# Patient Record
Sex: Female | Born: 1935 | Race: White | Hispanic: No | Marital: Married | Smoking: Never smoker
Health system: Southern US, Community
[De-identification: ages and names within clinical notes are randomized; demographics above are authoritative.]

## PROBLEM LIST (undated history)

## (undated) DIAGNOSIS — M810 Age-related osteoporosis without current pathological fracture: Secondary | ICD-10-CM

## (undated) DIAGNOSIS — I10 Essential (primary) hypertension: Secondary | ICD-10-CM

## (undated) DIAGNOSIS — M1990 Unspecified osteoarthritis, unspecified site: Secondary | ICD-10-CM

## (undated) DIAGNOSIS — C801 Malignant (primary) neoplasm, unspecified: Secondary | ICD-10-CM

## (undated) DIAGNOSIS — Z5189 Encounter for other specified aftercare: Secondary | ICD-10-CM

## (undated) DIAGNOSIS — K219 Gastro-esophageal reflux disease without esophagitis: Secondary | ICD-10-CM

## (undated) DIAGNOSIS — R011 Cardiac murmur, unspecified: Secondary | ICD-10-CM

## (undated) DIAGNOSIS — E079 Disorder of thyroid, unspecified: Secondary | ICD-10-CM

## (undated) HISTORY — DX: Age-related osteoporosis without current pathological fracture: M81.0

## (undated) HISTORY — PX: BREAST SURGERY: SHX581

## (undated) HISTORY — PX: MASTOID DEBRIDEMENT: SHX2002

## (undated) HISTORY — DX: Unspecified osteoarthritis, unspecified site: M19.90

## (undated) HISTORY — DX: Gastro-esophageal reflux disease without esophagitis: K21.9

## (undated) HISTORY — DX: Encounter for other specified aftercare: Z51.89

## (undated) HISTORY — PX: OTHER SURGICAL HISTORY: SHX169

## (undated) HISTORY — PX: SPINE SURGERY: SHX786

## (undated) HISTORY — DX: Malignant (primary) neoplasm, unspecified: C80.1

## (undated) HISTORY — DX: Essential (primary) hypertension: I10

## (undated) HISTORY — PX: APPENDECTOMY: SHX54

## (undated) HISTORY — DX: Disorder of thyroid, unspecified: E07.9

## (undated) HISTORY — DX: Cardiac murmur, unspecified: R01.1

## (undated) HISTORY — PX: ABDOMINAL HYSTERECTOMY: SHX81

---

## 2001-10-10 ENCOUNTER — Ambulatory Visit (HOSPITAL_COMMUNITY): Admission: RE | Admit: 2001-10-10 | Discharge: 2001-10-10 | Payer: Self-pay | Admitting: Internal Medicine

## 2001-10-10 ENCOUNTER — Encounter: Payer: Self-pay | Admitting: Internal Medicine

## 2002-03-02 ENCOUNTER — Encounter: Payer: Self-pay | Admitting: Internal Medicine

## 2002-03-02 ENCOUNTER — Ambulatory Visit (HOSPITAL_COMMUNITY): Admission: RE | Admit: 2002-03-02 | Discharge: 2002-03-02 | Payer: Self-pay | Admitting: Internal Medicine

## 2002-08-17 ENCOUNTER — Ambulatory Visit (HOSPITAL_COMMUNITY): Admission: RE | Admit: 2002-08-17 | Discharge: 2002-08-17 | Payer: Self-pay | Admitting: Internal Medicine

## 2002-08-17 ENCOUNTER — Encounter: Payer: Self-pay | Admitting: Internal Medicine

## 2002-10-24 ENCOUNTER — Encounter: Payer: Self-pay | Admitting: Orthopaedic Surgery

## 2002-10-27 ENCOUNTER — Encounter: Payer: Self-pay | Admitting: Orthopaedic Surgery

## 2002-10-27 ENCOUNTER — Encounter (INDEPENDENT_AMBULATORY_CARE_PROVIDER_SITE_OTHER): Payer: Self-pay | Admitting: *Deleted

## 2002-10-27 ENCOUNTER — Inpatient Hospital Stay (HOSPITAL_COMMUNITY): Admission: RE | Admit: 2002-10-27 | Discharge: 2002-10-28 | Payer: Self-pay | Admitting: Orthopaedic Surgery

## 2006-07-03 ENCOUNTER — Encounter: Admission: RE | Admit: 2006-07-03 | Discharge: 2006-07-03 | Payer: Self-pay | Admitting: Orthopaedic Surgery

## 2009-02-26 ENCOUNTER — Encounter: Admission: RE | Admit: 2009-02-26 | Discharge: 2009-02-26 | Payer: Self-pay | Admitting: Orthopaedic Surgery

## 2009-07-10 DEATH — deceased

## 2011-03-27 NOTE — H&P (Signed)
NAMEMarland Kitchen  Donna Hill, Donna Hill                          ACCOUNT NO.:  [id number]   MEDICAL RECORD NO.:  [id number]                   PATIENT TYPE:  OIB   LOCATION:  5025                                 FACILITY:  [facility]   PHYSICIAN:  Sharolyn Douglas, M.D.                     DATE OF BIRTH:  1934-05-27   DATE OF ADMISSION:  10/27/2002  DATE OF DISCHARGE:                                HISTORY & PHYSICAL   CHIEF COMPLAINT:  Back pain.   HISTORY OF PRESENT ILLNESS:  The patient is a 77 year old female with back  pain since October 2003.  It is severe.  It is affecting her activities of  daily living, keeping her up at night.  It has gotten to the point that it  is quite frustrating to her.  Risks and benefits of kyphoplasty procedure  were discussed with the patient by Sharolyn Douglas, M.D., and she indicated  understanding and therefore, elected to proceed.   ALLERGIES:  CODEINE causes nausea and vomiting; ANTIHISTAMINES cause  headaches.   MEDICATIONS:  1. Synthroid 0.15 mg p.o. q.d.  2. Actonel 30 mg p.o. every week.  3. Aciphex 30 mg p.o. q.d.  4. Vioxx 50 mg p.o. q.d.   PAST MEDICAL HISTORY:  1. Heart murmur.  2. GERD.  3. Hypothyroidism.  4. Osteoporosis.   PAST SURGICAL HISTORY:  1. Hysterectomy.  2. Thyroidectomy.   SOCIAL HISTORY:  The patient denies tobacco, denies alcohol.  She has three  children.  She is married.  She lives in a one level home and does have  family to help her through her postoperative course.   FAMILY MEDICAL HISTORY:  CHF, uterine cancer, and osteoarthritis.   REVIEW OF SYSTEMS:  The patient denies any fevers, chills, sweats,  ______________, blurred vision, double vision, seizure, headache, paralysis.  CARDIOVASCULAR: No chest pain, angina.  No claudication or palpitation.  PULMONARY: No shortness of breath or hemoptysis.  GI: Denies nausea,  vomiting, constipation, diarrhea.  Mild ______________.  She denies dysuria,  hematuria.  MUSCULOSKELETAL: Denies  any paresthesias, numbness, or paralysis  of her extremities.   PHYSICAL EXAMINATION:  VITAL SIGNS:  Blood pressure 152/82, respirations 16  and unlabored, pulse 66 and regular.  GENERAL:  This is a 77 year old white female who is alert and oriented in no  acute distress, well nourished, well groomed, appears her stated age,  pleasant, cooperative to examination.  HEENT: Normocephalic, atraumatic.  Pupils are equal, round, and reactive.  Extraocular movements are intact.  Nose: Patent.  Pharynx: Clear.  NECK: Supple to palpation, no bruits  appreciated, no lymphadenopathy or thyromegaly noted.  CHEST: Clear to  auscultation bilaterally, no rales, rhonchi, stridor or wheezes.  ________________ not performed.  HEART: S1, S2, regular rate and rhythm, no  murmurs, gallops, or rubs appreciated on today's exam.  ABDOMEN: Soft to  palpation, nontender, nondistended, no organomegaly noted.  GU: Not  pertinent, not performed.  EXTREMITIES: Sensation is intact to bilateral  lower extremities.  Motor function is grossly intact.  Pulses are intact and  equal.  SKIN: Intact without any lesions or rashes.   LABORATORY DATA:  X-ray shows T8 compression fracture.   IMPRESSION:  1. T8 compression fracture.  2. History of heart murmur.  3. Gastroesophageal reflux disease.  4. Hypothyroidism.   PLAN:  1. Admit to [facility] on October 27, 2002 for a T8 kyphoplasty to     be done by Sharolyn Douglas, M.D.  2. The patient's primary care physician is Angelena Sole, M.D. [facility].     Verlin Fester, P.A.                       Sharolyn Douglas, M.D.    CM/MEDQ  D:  11/04/2002  T:  11/04/2002  Job:  [id number]

## 2011-03-27 NOTE — Op Note (Signed)
NAMEMarland Kitchen  Donna Hill, Donna Hill                          ACCOUNT NO.:  [id number]   MEDICAL RECORD NO.:  [id number]                   PATIENT TYPE:  OIB   LOCATION:  5025                                 FACILITY:  [facility]   PHYSICIAN:  Sharolyn Douglas, M.D.                     DATE OF BIRTH:  [birth year]   DATE OF PROCEDURE:  10/27/2002  DATE OF DISCHARGE:  10/28/2002                                 OPERATIVE REPORT   DIAGNOSIS:  Pathologic T8 compression fracture with intractable pain.   PROCEDURE:  1. Closed reduction of T8 vertebral compression fracture.  2. Thoracic percutaneous vertebroplasty, T8.  3. Deep vertebral bone biopsy, T8.  4. Radiographic interpretation of fluoroscopic images used for     vertebroplasty, T8.   SURGEON:  Sharolyn Douglas, M.D.   ASSISTANT:  Verlin Fester, P.A.   ANESTHESIA:  General endotracheal.   COMPLICATIONS:  None.   INDICATIONS FOR PROCEDURE:  This patient is a 77 year old female who has a  history of osteoporosis and compression fractures.  She has a greater than 3  month history of intractable midthoracic pain.  MRI scan and radiographs  showed a T8 compression deformity with progressive collapse.  Her bone scan  showed this lesion to be hot.  She had several other compression  deformities, including T6 and T11 that did not light up on the bone scan or  MRI.  She tried bracing and pain medication.  Because of her persistent  symptoms, she elected to undergo kyphoplasty in hopes of restoring her  deformity and improving her pain.   PROCEDURE:  The patient was properly identified in the holding area and  taken to the operating room.  She underwent general endotracheal anesthesia  without difficulty. She was given prophylactic IV antibiotics.  She was  carefully turned prone with bolsters placed transversely across her  shoulders and pelvis in order to posturally reduce the fracture.  We used  fluoroscopy to confirm that we had a partial reduction of the T8  fracture.  We then prepped and draped the back in the usual sterile fashion.  Biplanar  fluoroscopy was brought into the field.  We obtained true AP and lateral  images of T8.  A Jamshidi needle was used to cannulate the pedicle on the  left side using live AP and lateral fluoroscopy.  A guide wire was placed  through the Jamshidi needle.  A working cannula  was established over the  guide wire.  We used a biopsy trocar to obtain a bone biopsy of the T8  vertebral body.  This was sent to pathology for routine examination.  We  then placed an intervertebral bone tamp through the working cannula.  This  was inflated to 3 cc.  We watched it from the live fluoroscopy.  We were  able to cross the midline.  We lifted the end plates up approximately 30%.  We then removed the intervertebral bone tamp and placed 3 cc of semi-  hardened methyl methacrylate cement mixed with barium through the working  cannula into the cavity created by the bone tamp.  This was observed under  direct AP and lateral imaging.  The working cannula was removed.  Final AP  lateral images were saved.  3 cc of 0.5% Marcaine were injected into the  small stab incision for postoperative  anesthesia.  A simple 3-0 nylon suture was utilized to approximate the skin  edges.  A sterile dressing was applied.  The patient was turned supine, was  extubated without difficulty, and transferred to the recovery room, able to  move her upper and lower extremities without difficulty.                                               Sharolyn Douglas, M.D.    MC/MEDQ  D:  10/27/2002  T:  10/29/2002  Job:  [id number]

## 2011-06-25 ENCOUNTER — Other Ambulatory Visit: Payer: Self-pay | Admitting: Orthopaedic Surgery

## 2011-06-25 DIAGNOSIS — M545 Low back pain: Secondary | ICD-10-CM

## 2011-06-26 ENCOUNTER — Ambulatory Visit
Admission: RE | Admit: 2011-06-26 | Discharge: 2011-06-26 | Disposition: A | Discharge status: Home / Self Care | Payer: Medicare Other | Source: Ambulatory Visit | Attending: Orthopaedic Surgery | Admitting: Orthopaedic Surgery

## 2011-06-26 DIAGNOSIS — M545 Low back pain, unspecified: Secondary | ICD-10-CM

## 2012-09-10 ENCOUNTER — Ambulatory Visit (INDEPENDENT_AMBULATORY_CARE_PROVIDER_SITE_OTHER): Payer: Medicare Other | Admitting: Internal Medicine

## 2012-09-10 ENCOUNTER — Ambulatory Visit: Payer: Medicare Other

## 2012-09-10 VITALS — BP 100/68 | HR 87 | Temp 98.2°F | Resp 18 | Ht <= 58 in | Wt 115.0 lb

## 2012-09-10 DIAGNOSIS — R05 Cough: Secondary | ICD-10-CM

## 2012-09-10 DIAGNOSIS — R059 Cough, unspecified: Secondary | ICD-10-CM

## 2012-09-10 DIAGNOSIS — M810 Age-related osteoporosis without current pathological fracture: Secondary | ICD-10-CM

## 2012-09-10 DIAGNOSIS — Z201 Contact with and (suspected) exposure to tuberculosis: Secondary | ICD-10-CM

## 2012-09-10 DIAGNOSIS — E039 Hypothyroidism, unspecified: Secondary | ICD-10-CM

## 2012-09-10 DIAGNOSIS — R61 Generalized hyperhidrosis: Secondary | ICD-10-CM

## 2012-09-10 DIAGNOSIS — I10 Essential (primary) hypertension: Secondary | ICD-10-CM

## 2012-09-10 DIAGNOSIS — K219 Gastro-esophageal reflux disease without esophagitis: Secondary | ICD-10-CM

## 2012-09-11 DIAGNOSIS — K219 Gastro-esophageal reflux disease without esophagitis: Secondary | ICD-10-CM | POA: Insufficient documentation

## 2012-09-11 DIAGNOSIS — E039 Hypothyroidism, unspecified: Secondary | ICD-10-CM | POA: Insufficient documentation

## 2012-09-11 DIAGNOSIS — I10 Essential (primary) hypertension: Secondary | ICD-10-CM | POA: Insufficient documentation

## 2012-09-11 DIAGNOSIS — M810 Age-related osteoporosis without current pathological fracture: Secondary | ICD-10-CM | POA: Insufficient documentation

## 2012-09-11 NOTE — Progress Notes (Signed)
  Subjective:    Patient ID: Donna Hill, female    DOB: 08-Oct-1934, 78 y.o.   MRN: [id number]  HPItoddler grandchild dx w/ TB this week --family needs screening Donna Hill Has complained of chronic cough for 4 or 5 months/she has a history of night sweats but no fevers/is no history weight loss She has no known exposure to TB/there is no fatigue or activity change over the last few months Evaluation by her primary care provider provided no etiology for the cough but chest x-ray was done  Patient Active Problem List  Diagnosis  . Hypertension  . Hypothyroidism  . Osteoporosis  . GERD (gastroesophageal reflux disease)    Prior to Admission medications   Medication Sig Start Date End Date Taking? Authorizing Provider  bisoprolol-hydrochlorothiazide (ZIAC) 10-6.25 MG per tablet Take 1 tablet by mouth daily.   Yes Historical Provider, MD  levothyroxine (SYNTHROID, LEVOTHROID) 150 MCG tablet Take 150 mcg by mouth daily.   Yes Historical Provider, MD  olmesartan-hydrochlorothiazide (BENICAR HCT) 20-12.5 MG per tablet Take 1 tablet by mouth daily.   Yes Historical Provider, MD  RABEprazole (ACIPHEX) 20 MG tablet Take 20 mg by mouth daily.   Yes Historical Provider, MD  risedronate (ACTONEL) 150 MG tablet Take 150 mg by mouth every 30 (thirty) days. with water on empty stomach, nothing by mouth or lie down for next 30 minutes.   Yes Historical Provider, MD       Review of Systems No fever chills night sweats weight loss or fatigue   No shortness of breath or dyspnea on exertion No chest pain or palpitations No peripheral edema No weight loss Objective:   Physical Exam Filed Vitals:   09/10/12 1706  BP: 100/68  Pulse: 87  Temp: 98.2 F (36.8 C)  Resp: 18   Kyphotic posture HEENT clear Lungs clear to auscultation Heart regular without murmur Extremities without edema       [facility] reading (PRIMARY) by  Dr. Leonia Heatherly=chronic changes but no signs of TB//   Assessment & Plan:    Problem #1 exposure to TB Problem #2 chronic cough Problem #3 other medical problems as noted  Quant interferon gold Reassured

## 2012-09-14 ENCOUNTER — Encounter: Payer: Self-pay | Admitting: Internal Medicine

## 2012-09-15 ENCOUNTER — Telehealth: Payer: Self-pay | Admitting: Radiology

## 2012-09-15 NOTE — Telephone Encounter (Signed)
I mailed her a letter w/ results on 11/6 so she hasn't gotten them yet I think she waits 1 year before retesting unless she develops symptoms Let me know what [facility] says

## 2012-09-15 NOTE — Telephone Encounter (Signed)
Patient called, for her lab results, they have not been reviewed yet. I have spoken to patient, advised her I will call her with results. The results are negative, however I have questions about her follow up, whether or not she will need testing again at a later date due to exposure. I have discussed with Chelle and she has reccommended we call the TB clinic to get their recommendations I have left message for the TB clinic to call back about this patient.  Patient call back # is [phone]. Also she forgot to tell Dr Merla Riches she had hx of thyroid CA, I have put this in the computer.  FYI only

## 2012-09-15 NOTE — Telephone Encounter (Signed)
Thanks they recommend repeat of this blood test in 8 weeks if she had a true exposure to active tuberculosis. They state they have no records of a 78 yr old with TB, was this a positive skin test, or a true active case of TB? If it was an active TB case there is concern at the health dept it may not have been reported. Please advise.

## 2012-09-16 NOTE — Telephone Encounter (Signed)
Patient and daughter reported to me that this was a positive case and they have been referred for all family members be tested/have no other information or confirmation You'll have to call the patient or her daughter to get further information at this point

## 2012-09-17 ENCOUNTER — Telehealth: Payer: Self-pay

## 2012-09-17 NOTE — Telephone Encounter (Signed)
Pt called in regards to TB test results. Please call pt back  @ [phone]

## 2012-09-18 ENCOUNTER — Telehealth: Payer: Self-pay

## 2012-09-18 NOTE — Telephone Encounter (Signed)
Advised to get mucinex dm and to come in if no better

## 2012-09-18 NOTE — Telephone Encounter (Signed)
Will it be okay to notify pt that tb lab test was negative and that she needs to RTC

## 2012-09-18 NOTE — Telephone Encounter (Signed)
PATIENT NOTIFIED TB RESULTS HAVE BEEN MAILED TO HER SINCE 11/6, SHE HAS CALLED SEVERAL TIMES WANTING RESULTS. PATIENT IS NOW REPORTING COUGH AND TAKING MUCI NEX BUT HER SYMPTOMS HAVE GOTTEN WORSE. SHE BELIEVES SHE HAS PNEUMONIA. PATIENT  IS ADVISED IF SHE IS ILL TO PLEASE BE SEEN IF ITS AN EMERGENCY.

## 2012-09-18 NOTE — Telephone Encounter (Signed)
yes

## 2012-09-19 ENCOUNTER — Encounter: Payer: Self-pay | Admitting: Internal Medicine

## 2012-09-19 NOTE — Telephone Encounter (Signed)
Then the negative test should prove she did not catch TB with that exposure-no need for retesting Thanks for getting the complete history

## 2012-09-19 NOTE — Telephone Encounter (Signed)
Patient advised of normal results. Advised will call her back to let her know if further testing is required.

## 2012-09-19 NOTE — Telephone Encounter (Signed)
Thanks, called patient she is advised no need for further testing. Left message for her.

## 2012-09-19 NOTE — Telephone Encounter (Signed)
The child was visiting from out of town. Last Spring, baby is from [facility]. Baby was treated in hospital, long duration. Had lymph nodes removed, then dx was made based on the results of the lymph node biopsy. Baby has been treated. Health Dept had recommeded repeat testing if this was new exposure, but was several months ago. I told patient I will discuss with you then call her back. Amy

## 2012-11-29 ENCOUNTER — Other Ambulatory Visit: Payer: Self-pay | Admitting: Dermatology

## 2013-03-10 ENCOUNTER — Other Ambulatory Visit: Payer: Self-pay | Admitting: Dermatology

## 2014-09-13 ENCOUNTER — Other Ambulatory Visit: Payer: Self-pay | Admitting: Orthopaedic Surgery

## 2014-09-13 DIAGNOSIS — M545 Low back pain: Secondary | ICD-10-CM

## 2014-09-13 DIAGNOSIS — M419 Scoliosis, unspecified: Secondary | ICD-10-CM

## 2014-09-23 ENCOUNTER — Ambulatory Visit
Admission: RE | Admit: 2014-09-23 | Discharge: 2014-09-23 | Disposition: A | Discharge status: Home / Self Care | Payer: Medicare Other | Source: Ambulatory Visit | Attending: Orthopaedic Surgery | Admitting: Orthopaedic Surgery

## 2014-09-23 DIAGNOSIS — M545 Low back pain: Secondary | ICD-10-CM

## 2014-09-23 DIAGNOSIS — M419 Scoliosis, unspecified: Secondary | ICD-10-CM

## 2016-12-07 ENCOUNTER — Other Ambulatory Visit: Payer: Self-pay | Admitting: Dermatology

## 2018-06-03 ENCOUNTER — Other Ambulatory Visit: Payer: Self-pay | Admitting: Rehabilitation

## 2018-06-03 DIAGNOSIS — T148XXA Other injury of unspecified body region, initial encounter: Secondary | ICD-10-CM

## 2018-06-11 ENCOUNTER — Ambulatory Visit
Admission: RE | Admit: 2018-06-11 | Discharge: 2018-06-11 | Disposition: A | Discharge status: Home / Self Care | Payer: Medicare Other | Source: Ambulatory Visit | Attending: Rehabilitation | Admitting: Rehabilitation

## 2018-06-11 DIAGNOSIS — T148XXA Other injury of unspecified body region, initial encounter: Secondary | ICD-10-CM

## 2019-06-15 ENCOUNTER — Emergency Department (HOSPITAL_COMMUNITY)
Admission: EM | Admit: 2019-06-15 | Discharge: 2019-06-15 | Disposition: A | Discharge status: Home / Self Care | Payer: Medicare Other | Attending: Emergency Medicine | Admitting: Emergency Medicine

## 2019-06-15 ENCOUNTER — Encounter (HOSPITAL_COMMUNITY): Payer: Self-pay | Admitting: Emergency Medicine

## 2019-06-15 ENCOUNTER — Other Ambulatory Visit: Payer: Self-pay

## 2019-06-15 ENCOUNTER — Emergency Department (HOSPITAL_COMMUNITY): Payer: Medicare Other

## 2019-06-15 DIAGNOSIS — R011 Cardiac murmur, unspecified: Secondary | ICD-10-CM | POA: Diagnosis not present

## 2019-06-15 DIAGNOSIS — M25532 Pain in left wrist: Secondary | ICD-10-CM | POA: Diagnosis not present

## 2019-06-15 DIAGNOSIS — Z885 Allergy status to narcotic agent status: Secondary | ICD-10-CM | POA: Insufficient documentation

## 2019-06-15 DIAGNOSIS — Z85850 Personal history of malignant neoplasm of thyroid: Secondary | ICD-10-CM | POA: Diagnosis not present

## 2019-06-15 DIAGNOSIS — Z79899 Other long term (current) drug therapy: Secondary | ICD-10-CM | POA: Diagnosis not present

## 2019-06-15 DIAGNOSIS — Z888 Allergy status to other drugs, medicaments and biological substances status: Secondary | ICD-10-CM | POA: Insufficient documentation

## 2019-06-15 DIAGNOSIS — I10 Essential (primary) hypertension: Secondary | ICD-10-CM | POA: Diagnosis not present

## 2019-06-15 DIAGNOSIS — M25432 Effusion, left wrist: Secondary | ICD-10-CM

## 2019-06-15 DIAGNOSIS — R2232 Localized swelling, mass and lump, left upper limb: Secondary | ICD-10-CM | POA: Diagnosis present

## 2019-06-15 LAB — CBC WITH DIFFERENTIAL/PLATELET
Abs Immature Granulocytes: 0.04 10*3/uL (ref 0.00–0.07)
Basophils Absolute: 0 10*3/uL (ref 0.0–0.1)
Basophils Relative: 0 %
Eosinophils Absolute: 0 10*3/uL (ref 0.0–0.5)
Eosinophils Relative: 0 %
HCT: 39.1 % (ref 36.0–46.0)
Hemoglobin: 12.3 g/dL (ref 12.0–15.0)
Immature Granulocytes: 1 %
Lymphocytes Relative: 6 %
Lymphs Abs: 0.4 10*3/uL — ABNORMAL LOW (ref 0.7–4.0)
MCH: 27.3 pg (ref 26.0–34.0)
MCHC: 31.5 g/dL (ref 30.0–36.0)
MCV: 86.7 fL (ref 80.0–100.0)
Monocytes Absolute: 0.9 10*3/uL (ref 0.1–1.0)
Monocytes Relative: 12 %
Neutro Abs: 6.4 10*3/uL (ref 1.7–7.7)
Neutrophils Relative %: 81 %
Platelets: 235 10*3/uL (ref 150–400)
RBC: 4.51 MIL/uL (ref 3.87–5.11)
RDW: 14.5 % (ref 11.5–15.5)
WBC: 7.8 10*3/uL (ref 4.0–10.5)
nRBC: 0 % (ref 0.0–0.2)

## 2019-06-15 LAB — URIC ACID: Uric Acid, Serum: 5.2 mg/dL (ref 2.5–7.1)

## 2019-06-15 LAB — C-REACTIVE PROTEIN: CRP: 12.3 mg/dL — ABNORMAL HIGH (ref <1.0)

## 2019-06-15 LAB — SEDIMENTATION RATE: Sed Rate: 45 mm/hr — ABNORMAL HIGH (ref 0–22)

## 2019-06-15 LAB — BASIC METABOLIC PANEL
Anion gap: 13 (ref 5–15)
BUN: 22 mg/dL (ref 8–23)
CO2: 27 mmol/L (ref 22–32)
Calcium: 10.5 mg/dL — ABNORMAL HIGH (ref 8.9–10.3)
Chloride: 95 mmol/L — ABNORMAL LOW (ref 98–111)
Creatinine, Ser: 0.9 mg/dL (ref 0.44–1.00)
GFR calc Af Amer: >60 mL/min (ref >60)
GFR calc non Af Amer: 59 mL/min — ABNORMAL LOW (ref >60)
Glucose, Bld: 113 mg/dL — ABNORMAL HIGH (ref 70–99)
Potassium: 3 mmol/L — ABNORMAL LOW (ref 3.5–5.1)
Sodium: 135 mmol/L (ref 135–145)

## 2019-06-15 MED ORDER — ACETAMINOPHEN 500 MG PO TABS
500.0000 mg | ORAL_TABLET | Freq: Once | ORAL | Status: AC
  Administered 2019-06-15: 17:00:00 500 mg via ORAL
  Filled 2019-06-15: qty 1

## 2019-06-15 MED ORDER — PREDNISONE 20 MG PO TABS
40.0000 mg | ORAL_TABLET | Freq: Once | ORAL | Status: AC
  Administered 2019-06-15: 22:00:00 40 mg via ORAL
  Filled 2019-06-15: qty 2

## 2019-06-15 MED ORDER — PREDNISONE 5 MG (21) PO TBPK: ORAL_TABLET | ORAL | 0 refills | Status: DC

## 2019-06-15 NOTE — ED Notes (Signed)
Family at bedside. 

## 2019-06-15 NOTE — ED Triage Notes (Signed)
Pt from home.  Pt reports left arm numbness starting when she woke up this AM (0600) Pt also reports new onset swelling of left wrist.

## 2019-06-15 NOTE — Discharge Instructions (Signed)
Please use steroids as prescribed.  Please go to your follow-up appointment tomorrow morning with the hand surgeon.  Recommend Tylenol, Motrin as needed for pain control.  Tomorrow morning, do not eat or drink anything until you are cleared by the hand surgeon.

## 2019-06-15 NOTE — ED Notes (Signed)
ED Provider at bedside. 

## 2019-06-15 NOTE — ED Provider Notes (Signed)
[facility] DEPT Provider Note   CSN: [id number] Arrival date & time: 06/15/19  1017     History   Chief Complaint Chief Complaint  Patient presents with  . Extremity Weakness    left arm  . Joint Swelling    HPI Donna Hill is a 85 y.o. female.  Presents emergency department with chief complaint of left wrist swelling.  Patient states this morning she noted some mild amount of swelling in her left wrist that has progressed throughout the day today.  Has been accompanied by tingling sensation in her left forearm.  She denies any fevers.  States her wrist is also had some pain.  Currently not in any significant pain.  Pain is worsened by any movement.  Currently 3 out of 10 in severity, dull, aching.  Patient states yesterday stung by a bee in her leg.  Has not noted any significant swelling or rash.     HPI  Past Medical History:  Diagnosis Date  . Arthritis   . Blood transfusion without reported diagnosis   . Cancer [facility])    thyroid cancer  . GERD (gastroesophageal reflux disease)   . Heart murmur   . Hypertension   . Osteoporosis   . Thyroid disease     Patient Active Problem List   Diagnosis Date Noted  . Hypertension 09/11/2012  . Hypothyroidism 09/11/2012  . Osteoporosis 09/11/2012  . GERD (gastroesophageal reflux disease) 09/11/2012    Past Surgical History:  Procedure Laterality Date  . ABDOMINAL HYSTERECTOMY    . APPENDECTOMY    . BREAST SURGERY    . MASTOID DEBRIDEMENT    . SPINE SURGERY    . thyroid  cancer       OB History   No obstetric history on file.      Home Medications    Prior to Admission medications   Medication Sig Start Date End Date Taking? Authorizing Provider  calcium-vitamin D (OSCAL WITH D) 500-200 MG-UNIT tablet Take 1 tablet by mouth daily.   Yes [provider]  fluticasone (VERAMYST) 27.5 MCG/SPRAY nasal spray Place 1 spray into the nose daily as needed for rhinitis or  allergies.   Yes [provider]  PATANOL 0.1 % ophthalmic solution Place 1 drop into both eyes at bedtime. 03/27/19  Yes [provider]  potassium chloride (MICRO-K) 10 MEQ CR capsule Take 10 mEq by mouth daily. 03/27/19  Yes [provider]  RABEprazole (ACIPHEX) 20 MG tablet Take 20 mg by mouth daily.   Yes [provider]  SYNTHROID 137 MCG tablet Take 137 mcg by mouth daily. 03/27/19  Yes [provider]  olmesartan-hydrochlorothiazide (BENICAR HCT) 20-12.5 MG per tablet Take 1 tablet by mouth daily.    [provider]    Family History No family history on file.  Social History Social History   Tobacco Use  . Smoking status: Never Smoker  Substance Use Topics  . Alcohol use: Not on file  . Drug use: Not on file     Allergies   Codeine, Histamine, and Meperidine   Review of Systems Review of Systems  Constitutional: Negative for chills and fever.  HENT: Negative for ear pain and sore throat.   Eyes: Negative for pain and visual disturbance.  Respiratory: Negative for cough and shortness of breath.   Cardiovascular: Negative for chest pain and palpitations.  Gastrointestinal: Negative for abdominal pain and vomiting.  Genitourinary: Negative for dysuria and hematuria.  Musculoskeletal: Positive  for arthralgias and joint swelling. Negative for back pain.  Skin: Negative for color change and rash.  Neurological: Negative for seizures and syncope.  All other systems reviewed and are negative.    Physical Exam Updated Vital Signs BP (!) 158/69 (BP Location: Right Arm)   Pulse 79   Temp 98.8 F (37.1 C) (Oral)   Resp 16   Ht 4\' 6"  (1.372 m)   Wt 46.7 kg   SpO2 100%   BMI 24.83 kg/m   Physical Exam Vitals signs and nursing note reviewed.  Constitutional:      General: She is not in acute distress.    Appearance: She is well-developed.  HENT:     Head: Normocephalic and atraumatic.  Eyes:      Conjunctiva/sclera: Conjunctivae normal.  Neck:     Musculoskeletal: Neck supple.  Cardiovascular:     Rate and Rhythm: Normal rate and regular rhythm.     Heart sounds: No murmur.  Pulmonary:     Effort: Pulmonary effort is normal. No respiratory distress.     Breath sounds: Normal breath sounds.  Abdominal:     Palpations: Abdomen is soft.     Tenderness: There is no abdominal tenderness.  Skin:    General: Skin is warm and dry.     Comments: LUE: mild swelling over left wrist, mild redness over joint, wrist ROM limited due to pain, no TTP over hand, forearm, elbow or upper arm  Neurological:     Mental Status: She is alert.     Comments: 5/5 strength in BUE, BLE Sensation to light touch intact throughout all four extremities      ED Treatments / Results  Labs (all labs ordered are listed, but only abnormal results are displayed) Labs Reviewed  CBC WITH DIFFERENTIAL/PLATELET  SEDIMENTATION RATE  C-REACTIVE PROTEIN  BASIC METABOLIC PANEL    EKG None  Radiology Dg Wrist Complete Left  Result Date: 06/15/2019 CLINICAL DATA:  Swollen left wrist concern for septic joint EXAM: LEFT WRIST - COMPLETE 3+ VIEW COMPARISON:  None. FINDINGS: No fracture or malalignment. Cartilaginous calcification. Advanced arthritis at the first [facility] joint with radial subluxation base of the first metacarpal. No gross osseous destruction. Diffuse soft tissue swelling about the wrist. IMPRESSION: 1. No acute osseous abnormality. Diffuse soft tissue swelling. If septic joint remains a concern, further evaluation with MRI should be considered. 2. Advanced arthropathy at the first [facility] joint 3. Chondrocalcinosis Electronically Signed   By: Donavan Foil M.D.   On: 06/15/2019 15:59    Procedures Procedures (including critical care time)  Medications Ordered in ED Medications  acetaminophen (TYLENOL) tablet 500 mg (500 mg Oral Given 06/15/19 1630)     Initial Impression / Assessment and Plan / ED Course   I have reviewed the triage vital signs and the nursing notes.  Pertinent labs & imaging results that were available during my care of the patient were reviewed by me and considered in my medical decision making (see chart for details).  Clinical Course as of Jun 15 2315  Thu Jun 15, 2019  1726 Reviewed labs, will c/s hand surgery   [RD]    Clinical Course User Index [RD] Lucrezia Starch, MD       85 year old lady who presented to the emergency department with swelling, pain in her left wrist as well as tingling in her left forearm.  No leukocytosis, but certainly elevated CRP.  Soft tissue swelling on x-ray is noted.  Given swelling, reduced  range of motion, I was concerned about the possibility of the septic wrist.  Discussed case with hand surgery, Dr. Fredna Dow who evaluated patient at bedside.  He recommended a trial of steroids for more likely arthritis flare.  However, he arranged for close follow-up in his clinic tomorrow morning. He will reassess at that time for conservative management vs taking to OR for washout.  Final Clinical Impressions(s) / ED Diagnoses   Final diagnoses:  Left wrist pain  Wrist swelling, left    ED Discharge Orders    None       Lucrezia Starch, MD 06/15/19 2325

## 2019-06-15 NOTE — ED Notes (Signed)
MD Rogene Houston made aware of pt new onset left arm weakness, MD reported he will come see pt momentarily.  Will continue to monitor.

## 2019-06-16 NOTE — Consult Note (Signed)
Late entry Patient seen June 17, 2019 approximately 9 PM  Donna Hill is an 85 y.o. female.   Chief Complaint: Left wrist pain and swelling HPI: 85 year old female present with her husband.  She states she has had increasing pain swelling and redness of the left wrist since this morning.  No fevers chills or night sweats.  She does not remember a specific injuries.  She does not have a history of gout.  She describes a pain in the left wrist worsened with motion and alleviated with rest.  There is associated swelling and redness.  Case discussed with Madalyn Rob, MD and his note from 06/16/2019 reviewed. Xrays viewed and interpreted by me: AP lateral oblique views of the left wrist show no fracture dislocation or radiopaque foreign body.  There is chondrocalcinosis.  There are degenerative changes of the [facility] joint of the thumb with joint space loss subchondral sclerosis and osteophyte formation. Labs reviewed: White blood count 7.8; ESR 45; CRP 12.3; uric acid 5.2  Allergies:  Allergies  Allergen Reactions  . Codeine Nausea Only  . Histamine     Other reaction(s): Other (See Comments)  . Meperidine Nausea Only    Past Medical History:  Diagnosis Date  . Arthritis   . Blood transfusion without reported diagnosis   . Cancer [facility])    thyroid cancer  . GERD (gastroesophageal reflux disease)   . Heart murmur   . Hypertension   . Osteoporosis   . Thyroid disease     Past Surgical History:  Procedure Laterality Date  . ABDOMINAL HYSTERECTOMY    . APPENDECTOMY    . BREAST SURGERY    . MASTOID DEBRIDEMENT    . SPINE SURGERY    . thyroid  cancer      Family History: No family history on file.  Social History:   reports that she has never smoked. She does not have any smokeless tobacco history on file. No history on file for alcohol and drug.  Medications: (Not in a hospital admission)   Results for orders placed or performed during the hospital encounter of 06/15/19  (from the past 48 hour(s))  CBC with Differential     Status: Abnormal   Collection Time: 06/15/19  4:32 PM  Result Value Ref Range   WBC 7.8 4.0 - 10.5 K/uL   RBC 4.51 3.87 - 5.11 MIL/uL   Hemoglobin 12.3 12.0 - 15.0 g/dL   HCT 39.1 36.0 - 46.0 %   MCV 86.7 80.0 - 100.0 fL   MCH 27.3 26.0 - 34.0 pg   MCHC 31.5 30.0 - 36.0 g/dL   RDW 14.5 11.5 - 15.5 %   Platelets 235 150 - 400 K/uL   nRBC 0.0 0.0 - 0.2 %   Neutrophils Relative % 81 %   Neutro Abs 6.4 1.7 - 7.7 K/uL   Lymphocytes Relative 6 %   Lymphs Abs 0.4 (L) 0.7 - 4.0 K/uL   Monocytes Relative 12 %   Monocytes Absolute 0.9 0.1 - 1.0 K/uL   Eosinophils Relative 0 %   Eosinophils Absolute 0.0 0.0 - 0.5 K/uL   Basophils Relative 0 %   Basophils Absolute 0.0 0.0 - 0.1 K/uL   Immature Granulocytes 1 %   Abs Immature Granulocytes 0.04 0.00 - 0.07 K/uL    Comment: Performed at [facility], [place] [address] [place], [place] [address]  Sedimentation rate     Status: Abnormal   Collection Time: 06/15/19  4:32 PM  Result Value Ref Range   Sed Rate 45 (H) 0 - 22 mm/hr    Comment: Performed at [facility], [place] [address], [place], [place] [address]  Basic metabolic panel     Status: Abnormal   Collection Time: 06/15/19  4:32 PM  Result Value Ref Range   Sodium 135 135 - 145 mmol/L   Potassium 3.0 (L) 3.5 - 5.1 mmol/L   Chloride 95 (L) 98 - 111 mmol/L   CO2 27 22 - 32 mmol/L   Glucose, Bld 113 (H) 70 - 99 mg/dL   BUN 22 8 - 23 mg/dL   Creatinine, Ser 0.90 0.44 - 1.00 mg/dL   Calcium 10.5 (H) 8.9 - 10.3 mg/dL   GFR calc non Af Amer 59 (L) >60 mL/min   GFR calc Af Amer >60 >60 mL/min   Anion gap 13 5 - 15    Comment: Performed at [facility], [place] [address], [place], [place] [address]  Uric acid     Status: None   Collection Time: 06/15/19  4:32 PM  Result Value Ref Range   Uric Acid, Serum 5.2 2.5 - 7.1 mg/dL    Comment: Performed at [facility],  [place] [address], [place], [place] [address]  C-reactive protein     Status: Abnormal   Collection Time: 06/15/19  4:33 PM  Result Value Ref Range   CRP 12.3 (H) <1.0 mg/dL    Comment: Performed at [facility], [place] [address], [place], [place] [address]    Dg Wrist Complete Left  Result Date: 06/15/2019 CLINICAL DATA:  Swollen left wrist concern for septic joint EXAM: LEFT WRIST - COMPLETE 3+ VIEW COMPARISON:  None. FINDINGS: No fracture or malalignment. Cartilaginous calcification. Advanced arthritis at the first [facility] joint with radial subluxation base of the first metacarpal. No gross osseous destruction. Diffuse soft tissue swelling about the wrist. IMPRESSION: 1. No acute osseous abnormality. Diffuse soft tissue swelling. If septic joint remains a concern, further evaluation with MRI should be considered. 2. Advanced arthropathy at the first [facility] joint 3. Chondrocalcinosis Electronically Signed   By: Donavan Foil M.D.   On: 06/15/2019 15:59     A comprehensive review of systems was negative. Review of Systems: No fevers, chills, night sweats, chest pain, shortness of breath, nausea, vomiting, diarrhea, constipation, easy bleeding or bruising, headaches, dizziness, vision changes, fainting.   Blood pressure (!) 128/59, pulse 74, temperature 98.8 F (37.1 C), temperature source Oral, resp. rate 18, height _0  (1.372 m), weight 46.7 kg, SpO2 98 %.  General appearance: alert, cooperative and appears stated age Head: Normocephalic, without obvious abnormality, atraumatic Neck: supple, symmetrical, trachea midline Extremities: Intact sensation and capillary refill all digits.  +epl/fpl/io.  No wounds.  She is swollen at the left wrist.  There is mild rubor.  She is tender at the [facility] joint of the thumb and somewhat dorsally.  She has pain with motion of the wrist both passively and actively.  There is no proximal streaking. Pulses: 2+ and symmetric Skin: Skin color, texture,  turgor normal. No rashes or lesions Neurologic: Grossly normal Incision/Wound: none  Assessment/Plan Left wrist osteoarthritis exacerbation versus gout versus septic joint.  I discussed at length with Mrs. Saintil and her husband the nature of the condition.  I do not think this is likely to be a septic joint at this time given her normal white count.  I think this is most likely an arthritis exacerbation.  We discussed options including surgical drainage versus trial of oral steroids to see if this provides relief.  They are cautioned that if this is infection that it will make it worse.  Again I do not think this is infected.  I think a reasonable course is splinted and steroids with recheck in the morning to see if she is improved.  They agree with this plan of care.  Leanora Cover 06/16/2019, 1:14 PM

## 2020-01-25 ENCOUNTER — Ambulatory Visit: Payer: Self-pay | Admitting: Allergy and Immunology

## 2020-09-30 ENCOUNTER — Other Ambulatory Visit: Payer: Self-pay | Admitting: Rehabilitation

## 2020-09-30 DIAGNOSIS — S32000S Wedge compression fracture of unspecified lumbar vertebra, sequela: Secondary | ICD-10-CM

## 2020-10-08 ENCOUNTER — Other Ambulatory Visit: Payer: Self-pay

## 2020-10-08 ENCOUNTER — Ambulatory Visit
Admission: RE | Admit: 2020-10-08 | Discharge: 2020-10-08 | Disposition: A | Discharge status: Home / Self Care | Payer: Medicare Other | Source: Ambulatory Visit | Attending: Rehabilitation | Admitting: Rehabilitation

## 2020-10-08 DIAGNOSIS — S32000S Wedge compression fracture of unspecified lumbar vertebra, sequela: Secondary | ICD-10-CM

## 2020-12-03 ENCOUNTER — Other Ambulatory Visit: Payer: Self-pay | Admitting: Rehabilitation

## 2020-12-03 DIAGNOSIS — M545 Low back pain, unspecified: Secondary | ICD-10-CM

## 2020-12-23 ENCOUNTER — Other Ambulatory Visit: Payer: Self-pay

## 2020-12-23 ENCOUNTER — Ambulatory Visit
Admission: RE | Admit: 2020-12-23 | Discharge: 2020-12-23 | Disposition: A | Discharge status: Home / Self Care | Payer: Medicare Other | Source: Ambulatory Visit | Attending: Rehabilitation | Admitting: Rehabilitation

## 2020-12-23 DIAGNOSIS — M545 Low back pain, unspecified: Secondary | ICD-10-CM

## 2020-12-25 ENCOUNTER — Other Ambulatory Visit: Payer: Self-pay | Admitting: Orthopaedic Surgery

## 2020-12-25 DIAGNOSIS — M8008XA Age-related osteoporosis with current pathological fracture, vertebra(e), initial encounter for fracture: Secondary | ICD-10-CM

## 2020-12-26 ENCOUNTER — Other Ambulatory Visit: Payer: Medicare Other

## 2020-12-27 ENCOUNTER — Ambulatory Visit
Admission: RE | Admit: 2020-12-27 | Discharge: 2020-12-27 | Disposition: A | Discharge status: Home / Self Care | Payer: Medicare Other | Source: Ambulatory Visit | Attending: Orthopaedic Surgery | Admitting: Orthopaedic Surgery

## 2020-12-27 ENCOUNTER — Other Ambulatory Visit: Payer: Self-pay

## 2020-12-27 DIAGNOSIS — M8008XA Age-related osteoporosis with current pathological fracture, vertebra(e), initial encounter for fracture: Secondary | ICD-10-CM

## 2021-04-08 ENCOUNTER — Encounter (HOSPITAL_COMMUNITY): Payer: Self-pay | Admitting: *Deleted

## 2021-04-08 ENCOUNTER — Inpatient Hospital Stay (HOSPITAL_COMMUNITY)
Admission: EM | Admit: 2021-04-08 | Discharge: 2021-04-14 | DRG: 184 | Disposition: A | Discharge status: Skilled Nursing Facility | Payer: Medicare Other | Attending: Internal Medicine | Admitting: Internal Medicine

## 2021-04-08 ENCOUNTER — Emergency Department (HOSPITAL_COMMUNITY): Payer: Medicare Other

## 2021-04-08 ENCOUNTER — Other Ambulatory Visit: Payer: Self-pay

## 2021-04-08 DIAGNOSIS — M40204 Unspecified kyphosis, thoracic region: Secondary | ICD-10-CM | POA: Diagnosis present

## 2021-04-08 DIAGNOSIS — E873 Alkalosis: Secondary | ICD-10-CM | POA: Diagnosis present

## 2021-04-08 DIAGNOSIS — N1330 Unspecified hydronephrosis: Secondary | ICD-10-CM | POA: Diagnosis present

## 2021-04-08 DIAGNOSIS — Z79899 Other long term (current) drug therapy: Secondary | ICD-10-CM

## 2021-04-08 DIAGNOSIS — R0600 Dyspnea, unspecified: Secondary | ICD-10-CM

## 2021-04-08 DIAGNOSIS — K219 Gastro-esophageal reflux disease without esophagitis: Secondary | ICD-10-CM | POA: Diagnosis not present

## 2021-04-08 DIAGNOSIS — M1990 Unspecified osteoarthritis, unspecified site: Secondary | ICD-10-CM | POA: Diagnosis present

## 2021-04-08 DIAGNOSIS — E871 Hypo-osmolality and hyponatremia: Secondary | ICD-10-CM | POA: Diagnosis present

## 2021-04-08 DIAGNOSIS — D638 Anemia in other chronic diseases classified elsewhere: Secondary | ICD-10-CM | POA: Diagnosis present

## 2021-04-08 DIAGNOSIS — Z87440 Personal history of urinary (tract) infections: Secondary | ICD-10-CM

## 2021-04-08 DIAGNOSIS — R296 Repeated falls: Secondary | ICD-10-CM | POA: Diagnosis present

## 2021-04-08 DIAGNOSIS — E876 Hypokalemia: Secondary | ICD-10-CM | POA: Diagnosis present

## 2021-04-08 DIAGNOSIS — R338 Other retention of urine: Secondary | ICD-10-CM

## 2021-04-08 DIAGNOSIS — S2242XA Multiple fractures of ribs, left side, initial encounter for closed fracture: Secondary | ICD-10-CM

## 2021-04-08 DIAGNOSIS — I10 Essential (primary) hypertension: Secondary | ICD-10-CM | POA: Diagnosis present

## 2021-04-08 DIAGNOSIS — E860 Dehydration: Secondary | ICD-10-CM | POA: Diagnosis present

## 2021-04-08 DIAGNOSIS — Z85850 Personal history of malignant neoplasm of thyroid: Secondary | ICD-10-CM

## 2021-04-08 DIAGNOSIS — W19XXXA Unspecified fall, initial encounter: Secondary | ICD-10-CM | POA: Diagnosis present

## 2021-04-08 DIAGNOSIS — S2249XA Multiple fractures of ribs, unspecified side, initial encounter for closed fracture: Secondary | ICD-10-CM | POA: Diagnosis present

## 2021-04-08 DIAGNOSIS — M4854XD Collapsed vertebra, not elsewhere classified, thoracic region, subsequent encounter for fracture with routine healing: Secondary | ICD-10-CM | POA: Diagnosis present

## 2021-04-08 DIAGNOSIS — H9190 Unspecified hearing loss, unspecified ear: Secondary | ICD-10-CM | POA: Diagnosis not present

## 2021-04-08 DIAGNOSIS — Z20822 Contact with and (suspected) exposure to covid-19: Secondary | ICD-10-CM | POA: Diagnosis present

## 2021-04-08 DIAGNOSIS — R339 Retention of urine, unspecified: Secondary | ICD-10-CM | POA: Diagnosis present

## 2021-04-08 DIAGNOSIS — Z90710 Acquired absence of both cervix and uterus: Secondary | ICD-10-CM

## 2021-04-08 DIAGNOSIS — E039 Hypothyroidism, unspecified: Secondary | ICD-10-CM | POA: Diagnosis present

## 2021-04-08 DIAGNOSIS — J90 Pleural effusion, not elsewhere classified: Secondary | ICD-10-CM | POA: Diagnosis present

## 2021-04-08 DIAGNOSIS — E861 Hypovolemia: Secondary | ICD-10-CM | POA: Diagnosis present

## 2021-04-08 DIAGNOSIS — R531 Weakness: Secondary | ICD-10-CM | POA: Diagnosis present

## 2021-04-08 DIAGNOSIS — M810 Age-related osteoporosis without current pathological fracture: Secondary | ICD-10-CM | POA: Diagnosis present

## 2021-04-08 DIAGNOSIS — Z79890 Hormone replacement therapy (postmenopausal): Secondary | ICD-10-CM

## 2021-04-08 DIAGNOSIS — N390 Urinary tract infection, site not specified: Secondary | ICD-10-CM

## 2021-04-08 LAB — CBC WITH DIFFERENTIAL/PLATELET
Abs Immature Granulocytes: 0.04 10*3/uL (ref 0.00–0.07)
Basophils Absolute: 0 10*3/uL (ref 0.0–0.1)
Basophils Relative: 0 %
Eosinophils Absolute: 0 10*3/uL (ref 0.0–0.5)
Eosinophils Relative: 0 %
HCT: 33.9 % — ABNORMAL LOW (ref 36.0–46.0)
Hemoglobin: 10.7 g/dL — ABNORMAL LOW (ref 12.0–15.0)
Immature Granulocytes: 1 %
Lymphocytes Relative: 10 %
Lymphs Abs: 0.8 10*3/uL (ref 0.7–4.0)
MCH: 25.8 pg — ABNORMAL LOW (ref 26.0–34.0)
MCHC: 31.6 g/dL (ref 30.0–36.0)
MCV: 81.9 fL (ref 80.0–100.0)
Monocytes Absolute: 0.8 10*3/uL (ref 0.1–1.0)
Monocytes Relative: 11 %
Neutro Abs: 6 10*3/uL (ref 1.7–7.7)
Neutrophils Relative %: 78 %
Platelets: 289 10*3/uL (ref 150–400)
RBC: 4.14 MIL/uL (ref 3.87–5.11)
RDW: 15.5 % (ref 11.5–15.5)
WBC: 7.7 10*3/uL (ref 4.0–10.5)
nRBC: 0 % (ref 0.0–0.2)

## 2021-04-08 LAB — COMPREHENSIVE METABOLIC PANEL
ALT: 13 U/L (ref 0–44)
AST: 21 U/L (ref 15–41)
Albumin: 3.8 g/dL (ref 3.5–5.0)
Alkaline Phosphatase: 133 U/L — ABNORMAL HIGH (ref 38–126)
Anion gap: 11 (ref 5–15)
BUN: 21 mg/dL (ref 8–23)
CO2: 28 mmol/L (ref 22–32)
Calcium: 9.9 mg/dL (ref 8.9–10.3)
Chloride: 89 mmol/L — ABNORMAL LOW (ref 98–111)
Creatinine, Ser: 0.57 mg/dL (ref 0.44–1.00)
GFR, Estimated: >60 mL/min (ref >60)
Glucose, Bld: 100 mg/dL — ABNORMAL HIGH (ref 70–99)
Potassium: 4 mmol/L (ref 3.5–5.1)
Sodium: 128 mmol/L — ABNORMAL LOW (ref 135–145)
Total Bilirubin: 1.1 mg/dL (ref 0.3–1.2)
Total Protein: 6.6 g/dL (ref 6.5–8.1)

## 2021-04-08 LAB — URINALYSIS, ROUTINE W REFLEX MICROSCOPIC
Bilirubin Urine: NEGATIVE
Glucose, UA: NEGATIVE mg/dL
Ketones, ur: 5 mg/dL — AB
Leukocytes,Ua: NEGATIVE
Nitrite: NEGATIVE
Protein, ur: NEGATIVE mg/dL
Specific Gravity, Urine: 1.008 (ref 1.005–1.030)
pH: 7 (ref 5.0–8.0)

## 2021-04-08 MED ORDER — SODIUM CHLORIDE 0.9 % IV BOLUS
1000.0000 mL | Freq: Once | INTRAVENOUS | Status: AC
  Administered 2021-04-08: 1000 mL via INTRAVENOUS

## 2021-04-08 MED ORDER — ENOXAPARIN SODIUM 30 MG/0.3ML IJ SOSY
30.0000 mg | PREFILLED_SYRINGE | INTRAMUSCULAR | Status: DC
  Administered 2021-04-08 – 2021-04-13 (×6): 30 mg via SUBCUTANEOUS
  Filled 2021-04-08 (×6): qty 0.3

## 2021-04-08 MED ORDER — ACETAMINOPHEN 325 MG PO TABS
650.0000 mg | ORAL_TABLET | Freq: Four times a day (QID) | ORAL | Status: DC | PRN
  Administered 2021-04-08 – 2021-04-13 (×12): 650 mg via ORAL
  Filled 2021-04-08 (×12): qty 2

## 2021-04-08 MED ORDER — ONDANSETRON HCL 4 MG PO TABS: 4.0000 mg | ORAL_TABLET | Freq: Four times a day (QID) | ORAL | Status: DC | PRN

## 2021-04-08 MED ORDER — IBUPROFEN 200 MG PO TABS
200.0000 mg | ORAL_TABLET | Freq: Four times a day (QID) | ORAL | Status: DC | PRN
  Administered 2021-04-09 – 2021-04-14 (×3): 200 mg via ORAL
  Filled 2021-04-08 (×3): qty 1

## 2021-04-08 MED ORDER — DEXTROSE-NACL 5-0.9 % IV SOLN: INTRAVENOUS | Status: DC

## 2021-04-08 MED ORDER — FENTANYL CITRATE (PF) 100 MCG/2ML IJ SOLN
12.5000 ug | Freq: Once | INTRAMUSCULAR | Status: DC
  Filled 2021-04-08: qty 2

## 2021-04-08 MED ORDER — ACETAMINOPHEN 650 MG RE SUPP: 650.0000 mg | Freq: Four times a day (QID) | RECTAL | Status: DC | PRN

## 2021-04-08 MED ORDER — ONDANSETRON HCL 4 MG/2ML IJ SOLN: 4.0000 mg | Freq: Four times a day (QID) | INTRAMUSCULAR | Status: DC | PRN

## 2021-04-08 MED ORDER — LEVOTHYROXINE SODIUM 137 MCG PO TABS
137.0000 ug | ORAL_TABLET | Freq: Every day | ORAL | Status: DC
  Administered 2021-04-09 – 2021-04-14 (×6): 137 ug via ORAL
  Filled 2021-04-08 (×6): qty 1

## 2021-04-08 NOTE — ED Provider Notes (Signed)
Emergency Medicine Provider Triage Evaluation Note  Donna Hill , a 87 y.o. female  was evaluated in triage.  Pt complains of left-sided back/flank pain since waking up this morning.  Had a history of fall with rib fracture 1 month ago in this area.  No urinary symptoms or vomiting.  Review of Systems  Positive: \Flank pain Negative: Urinary symptoms, vomiting  Physical Exam  BP 128/89 (BP Location: Left Arm)   Pulse 99   Temp 98.2 F (36.8 C) (Oral)   Resp (!) 24   Ht 4\' 6"  (1.372 m)   Wt 44.5 kg   SpO2 95%   BMI 23.63 kg/m  Gen:   Awake, no distress   Resp:  Normal effort  MSK:   Moves extremities without difficulty  Other:  Tenderness palpation of the left flank area  Medical Decision Making  Medically screening exam initiated at 1:49 PM.  Appropriate orders placed.  Donna Hill was informed that the remainder of the evaluation will be completed by another provider, this initial triage assessment does not replace that evaluation, and the importance of remaining in the ED until their evaluation is complete.  Lab work and imaging ordered   Delia Heady, PA-C 04/08/21 [place]    Carmin Muskrat, MD 04/08/21 [phone]

## 2021-04-08 NOTE — H&P (Signed)
History and Physical    Donna Hill MWN:[id number] DOB: 1934/01/09 DOA: 04/08/2021  PCP: Red Christians, MD   Patient coming from: home   Chief Complaint: abdominal pain   HPI: Donna Hill is a 87 y.o. female with medical history significant of osteoarthritis, GERD, hypertension, osteoporosis and hypoacusis, who presents with lower abdominal pain. Most of the history is obtained from her husband at bedside, patient unable to give detailed history due to poor hearing. Apparently she was treated for a urinary tract infection about a month ago, she completed full course of antibiotics.  She has been complaining of lower abdominal pain since then, intermittent, but more severe over the last few days. The pain seems to be moderate to severe intensity, no improving or worsening factors, no dysuria associated.  Because of persistent pain she was brought into the hospital for further evaluation.  Old records personally reviewed, November 2021 ED visit at [facility] for abdominal pain/cystitis.  ED Course: Patient hemodynamically stable, she was found to have about 3000 mL of urinary retention. Urinary catheterization obtained about 1000 mL of urine with significant improvement of her symptoms. Incidental pleural effusion, patient was admitted for further evaluation.  Review of Systems: Unable to obtain due to patient's decreased hearing   Past Medical History:  Diagnosis Date  . Arthritis   . Blood transfusion without reported diagnosis   . Cancer [facility])    thyroid cancer  . GERD (gastroesophageal reflux disease)   . Heart murmur   . Hypertension   . Osteoporosis   . Thyroid disease     Past Surgical History:  Procedure Laterality Date  . ABDOMINAL HYSTERECTOMY    . APPENDECTOMY    . BREAST SURGERY    . MASTOID DEBRIDEMENT    . SPINE SURGERY    . thyroid  cancer       reports that she has never smoked. She does not have any smokeless tobacco history on file. No  history on file for alcohol use and drug use.  Allergies  Allergen Reactions  . Codeine Nausea Only  . Histamine     Other reaction(s): Other (See Comments)  . Meperidine Nausea Only    No family history on file.   Prior to Admission medications   Medication Sig Start Date End Date Taking? Authorizing Provider  BENICAR HCT 40-25 MG tablet Take 1 tablet by mouth daily. 03/27/19   [provider]  calcium-vitamin D (OSCAL WITH D) 500-200 MG-UNIT tablet Take 1 tablet by mouth daily.    [provider]  fluticasone (VERAMYST) 27.5 MCG/SPRAY nasal spray Place 1 spray into the nose daily as needed for rhinitis or allergies.    [provider]  PATANOL 0.1 % ophthalmic solution Place 1 drop into both eyes at bedtime. 03/27/19   [provider]  potassium chloride (MICRO-K) 10 MEQ CR capsule Take 10 mEq by mouth daily. 03/27/19   [provider]  predniSONE (STERAPRED UNI-PAK 21 TAB) 5 MG (21) TBPK tablet On day 1 take 5 tablets, then on Day 2 take 4 tablets, Day 3 take 3 tablets, Day 4 take 2 tablets, Day 5 take one tablet 06/15/19   Lucrezia Starch, MD  RABEprazole (ACIPHEX) 20 MG tablet Take 20 mg by mouth daily.    [provider]  SYNTHROID 137 MCG tablet Take 137 mcg by mouth daily. 03/27/19   [provider]    Physical Exam: Vitals:   04/08/21 1324 04/08/21 1515 04/08/21  1522 04/08/21 1617  BP: 128/89 (!) 143/75  (!) 145/92  Pulse: 99 78  (!) 122  Resp: (!) 24 16  20   Temp: 98.2 F (36.8 C)  98 F (36.7 C)   TempSrc: Oral  Oral   SpO2: 95% 95%  99%  Weight: 44.5 kg     Height: 4\' 6"  (1.372 m)       Vitals:   04/08/21 1324 04/08/21 1515 04/08/21 1522 04/08/21 1617  BP: 128/89 (!) 143/75  (!) 145/92  Pulse: 99 78  (!) 122  Resp: (!) 24 16  20   Temp: 98.2 F (36.8 C)  98 F (36.7 C)   TempSrc: Oral  Oral   SpO2: 95% 95%  99%  Weight: 44.5 kg     Height: 4\' 6"  (1.372 m)      General: Not in pain or dyspnea,  deconditioned and ill looking appearing  Neurology: Awake and alert, non focal Head and Neck. Head normocephalic. Neck supple with no adenopathy or thyromegaly.   E ENT: mild pallor, no icterus, oral mucosa dry Cardiovascular: No JVD. S1-S2 present, rhythmic, no gallops, rubs, or murmurs. No lower extremity edema. Pulmonary: positive breath sounds bilaterally, adequate air movement, no wheezing, rhonchi or rales. Gastrointestinal. Abdomen mild distended, tender to palpation at the lower abdomen. No rebound or guarding.  Skin. No rashes Musculoskeletal: no joint deformities    Labs on Admission: I have personally reviewed following labs and imaging studies  CBC: Recent Labs  Lab 04/08/21 1426  WBC 7.7  NEUTROABS 6.0  HGB 10.7*  HCT 33.9*  MCV 81.9  PLT 967   Basic Metabolic Panel: Recent Labs  Lab 04/08/21 1426  NA 128*  K 4.0  CL 89*  CO2 28  GLUCOSE 100*  BUN 21  CREATININE 0.57  CALCIUM 9.9   GFR: Estimated Creatinine Clearance: 29.9 mL/min (by C-G formula based on SCr of 0.57 mg/dL). Liver Function Tests: Recent Labs  Lab 04/08/21 1426  AST 21  ALT 13  ALKPHOS 133*  BILITOT 1.1  PROT 6.6  ALBUMIN 3.8   No results for input(s): LIPASE, AMYLASE in the last 168 hours. No results for input(s): AMMONIA in the last 168 hours. Coagulation Profile: No results for input(s): INR, PROTIME in the last 168 hours. Cardiac Enzymes: No results for input(s): CKTOTAL, CKMB, CKMBINDEX, TROPONINI in the last 168 hours. BNP (last 3 results) No results for input(s): PROBNP in the last 8760 hours. HbA1C: No results for input(s): HGBA1C in the last 72 hours. CBG: No results for input(s): GLUCAP in the last 168 hours. Lipid Profile: No results for input(s): CHOL, HDL, LDLCALC, TRIG, CHOLHDL, LDLDIRECT in the last 72 hours. Thyroid Function Tests: No results for input(s): TSH, T4TOTAL, FREET4, T3FREE, THYROIDAB in the last 72 hours. Anemia Panel: No results for input(s):  VITAMINB12, FOLATE, FERRITIN, TIBC, IRON, RETICCTPCT in the last 72 hours. Urine analysis:    Component Value Date/Time   COLORURINE STRAW (A) 04/08/2021 1350   APPEARANCEUR CLEAR 04/08/2021 1350   LABSPEC 1.008 04/08/2021 1350   PHURINE 7.0 04/08/2021 1350   GLUCOSEU NEGATIVE 04/08/2021 1350   HGBUR SMALL (A) 04/08/2021 1350   BILIRUBINUR NEGATIVE 04/08/2021 1350   KETONESUR 5 (A) 04/08/2021 1350   PROTEINUR NEGATIVE 04/08/2021 1350   NITRITE NEGATIVE 04/08/2021 [place] 04/08/2021 1350    Radiological Exams on Admission: DG Ribs Unilateral W/Chest Left  Result Date: 04/08/2021 CLINICAL DATA:  Fall 1 month ago, left-sided rib fracture EXAM: LEFT  RIBS AND CHEST - 3+ VIEW COMPARISON:  03/26/2021 FINDINGS: The heart is normal in size. Severe atherosclerosis of the aortic arch. Moderate left pleural effusion and associated atelectasis or consolidation, slightly increased compared to prior examination. Minimally displaced lower left rib fractures, partially callused. Multilevel kyphoplasty of the thoracic and lumbar spine. IMPRESSION: 1. Moderate left pleural effusion and associated atelectasis or consolidation, slightly increased compared to prior examination. 2. Minimally displaced lower left rib fractures, partially callused. Electronically Signed   By: Eddie Candle M.D.   On: 04/08/2021 14:58   CT Chest Wo Contrast  Result Date: 04/08/2021 CLINICAL DATA:  Left back and flank pain. EXAM: CT CHEST WITHOUT CONTRAST TECHNIQUE: Multidetector CT imaging of the chest was performed following the standard protocol without IV contrast. COMPARISON:  Chest radiograph 04/08/2021 and thoracic spine examination 03/26/2021 and thoracic spine MRI 12/27/2020 and abdominal CT 09/17/2020 FINDINGS: Cardiovascular: Thoracic aorta is heavily calcified. Normal caliber of the thoracic aorta. Coronary artery calcifications. Mediastinum/Nodes: No significant chest lymphadenopathy but limited  evaluation of the left hilum due to volume loss in left hemithorax. Lack of intravascular contrast also limits evaluation of the mediastinal structures. Lungs/Pleura: Trachea and mainstem bronchi are patent. Small to moderate amount of loculated left pleural fluid. Volume loss throughout the left lower lobe. Mild scarring at the right lung apex. Trace right pleural fluid. Upper Abdomen: Enlargement of the renal pelvis bilaterally. Evidence for at least mild hydronephrosis. The hydronephrosis is new since 09/17/2020. Limited evaluation of the abdomen without intravascular contrast. Musculoskeletal: Numerous old right rib fractures. Numerous old left rib fractures. Acute displaced fractures involving the posterior left eighth rib. Severely displaced fracture involving the posterior left ninth rib just posterior to the descending thoracic aorta. Segmental fractures involving the posterior left tenth rib posterior to the thoracic aorta. Displaced fracture involving the left eleventh rib. Displaced fracture involving the posterior left seventh rib. Severe kyphosis of the thoracic spine. Normal alignment at the cervicothoracic junction. There is a bone cement within the T8 vertebral body, T11 and T12 vertebral bodies. There are old compression fractures at T6, T7, T8, T10, T11 and T12. There also appears to be an old compression fracture involving superior endplate at L1. IMPRESSION: 1. Acute displaced fractures involving left posterior ribs (7th-11th ribs). 2. Small to moderate sized loculated left pleural effusion. Limited evaluation of the pleural fluid without intravascular contrast. 3. At least mild hydronephrosis bilaterally. Recommend further characterization with renal ultrasound or CT of the abdomen and pelvis. 4. Small amount of right pleural fluid. 5. Volume loss in the left lower lobe. 6. Multiple vertebral body compression fractures. These fractures have not significant changed since MRI on 12/27/2020.  Electronically Signed   By: Markus Daft M.D.   On: 04/08/2021 16:59    EKG: Independently reviewed. 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  Denies any dyspnea or chest pain.  On her initial physical examination blood pressure 145/92, heart rate 122, respiratory rate 20, oxygen saturation 99% on room air.  She has mild pallor, dry mucous membranes, her lungs with decreased breath sounds at bases bilaterally, heart S1-S2, present, rhythmic, soft abdomen, mildly distended, nontender, no lower extremity edema.   Sodium  128, potassium 4.0, chloride 89, bicarb 28, glucose 100, BUN 21, creatinine 0.57, white count 7.7, hemoglobin 10.7, hematocrit 33.9, platelets 289. SARS COVID-19 pending  Urinalysis specific gravity 1.008, negative proteins, negative nitrates, 0-5 red cells, 0-5 white cells.  Chest radiograph with left rotation, left pleural effusion, moderate.  CT chest with acute displaced fractures involving the left posterior ribs, 7-11.  Small to moderate size loculated left pleural effusion.  Mild bilateral hydronephrosis.  Small right pleural effusion.  Multiple vertebral body compression fractures, chronic.  Donna Hill will be admitted to the hospital with a working diagnosis of urinary retention.  1.  Acute urinary retention.  Patient had a recent urinary tract infection, currently her urinalysis is negative for infection. In the ED bladder scan showed about 3000 mL of retained urine, after catheterization more than that 1000 mL voided with significant improvement of her symptoms.  Continue pain control with acetaminophen, avoid narcotics.  If  recurrent urine retention patient may need a Foley catheter to be placed. Continue as needed bladder scans monitoring.   2.  Left pleural effusion.  Loculated effusion, likely chronic.  She is in no respiratory distress and her oxygenation is greater than 92% on room air  3.  Hypovolemic hyponatremia.  Continue hydration with isotonic saline, follow-up kidney function and electrolytes in the morning.  4.  Hypertension.  Hold on antihypertensive agents for now.  5.  Hypothyroidism.  Continue levothyroxine.  6.  Acute fractures, 7-11, chronic thoracic compression fractures.  Continue pain control with acetaminophen, low-dose of ibuprofen, avoid narcotics.  Consult physical therapy/Occupational Therapy.  Status is: Observation     Dispo: The patient is from: Home              Anticipated d/c is to: Home              Patient currently is not medically stable to d/c.   Difficult to place patient No       DVT prophylaxis: Enoxaparin   Code Status:   full  Family Communication:  .I spoke with patient's husband at the bedside, we talked in detail about patient's condition, plan of care and prognosis and all questions were addressed.     Consults called:  None   Admission status:  Observation    Aaron Bostwick Gerome Apley MD [facility]   04/08/2021, 5:21 PM

## 2021-04-08 NOTE — ED Provider Notes (Signed)
[facility] DEPT Provider Note   CSN: [id number] Arrival date & time: 04/08/21  1312     History Chief Complaint  Patient presents with  . Flank Pain    Donna Hill is a 87 y.o. female.  HPI Patient presents with concern of left-sided axillary pain.  She notes worsening weakness, secondary to pain, decreased activity.  Patient had a fall about 1 month ago, was diagnosed with a rib fracture.  Since that time she has had worsening pain in the same area.  No new vomiting, no new fever, there is dyspnea secondary to pain. No relief with anything.  Chart review including documents from [employer] notable for x-rays performed earlier this month with diagnosis of rib fracture as well as additional evaluation of chronic vertebral compression fractures. Social work notes from the time also note new arrangement for home health services which should be starting this week.  Some concern for the patient's husband and her having her medications mixed up.    Past Medical History:  Diagnosis Date  . Arthritis   . Blood transfusion without reported diagnosis   . Cancer [facility])    thyroid cancer  . GERD (gastroesophageal reflux disease)   . Heart murmur   . Hypertension   . Osteoporosis   . Thyroid disease     Patient Active Problem List   Diagnosis Date Noted  . Hypertension 09/11/2012  . Hypothyroidism 09/11/2012  . Osteoporosis 09/11/2012  . GERD (gastroesophageal reflux disease) 09/11/2012    Past Surgical History:  Procedure Laterality Date  . ABDOMINAL HYSTERECTOMY    . APPENDECTOMY    . BREAST SURGERY    . MASTOID DEBRIDEMENT    . SPINE SURGERY    . thyroid  cancer       OB History   No obstetric history on file.     No family history on file.  Social History   Tobacco Use  . Smoking status: Never Smoker    Home Medications Prior to Admission medications   Medication Sig Start Date End Date Taking?  Authorizing Provider  BENICAR HCT 40-25 MG tablet Take 1 tablet by mouth daily. 03/27/19   [provider]  calcium-vitamin D (OSCAL WITH D) 500-200 MG-UNIT tablet Take 1 tablet by mouth daily.    [provider]  fluticasone (VERAMYST) 27.5 MCG/SPRAY nasal spray Place 1 spray into the nose daily as needed for rhinitis or allergies.    [provider]  PATANOL 0.1 % ophthalmic solution Place 1 drop into both eyes at bedtime. 03/27/19   [provider]  potassium chloride (MICRO-K) 10 MEQ CR capsule Take 10 mEq by mouth daily. 03/27/19   [provider]  predniSONE (STERAPRED UNI-PAK 21 TAB) 5 MG (21) TBPK tablet On day 1 take 5 tablets, then on Day 2 take 4 tablets, Day 3 take 3 tablets, Day 4 take 2 tablets, Day 5 take one tablet 06/15/19   Lucrezia Starch, MD  RABEprazole (ACIPHEX) 20 MG tablet Take 20 mg by mouth daily.    [provider]  SYNTHROID 137 MCG tablet Take 137 mcg by mouth daily. 03/27/19   [provider]    Allergies    Codeine, Histamine, and Meperidine  Review of Systems   Review of Systems  Constitutional:       Per HPI, otherwise negative  HENT:       Per HPI, otherwise negative  Respiratory: Positive for shortness of breath.  Cardiovascular:       Per HPI, otherwise negative  Gastrointestinal: Negative for vomiting.  Endocrine:       Negative aside from HPI  Genitourinary:       Neg aside from HPI   Musculoskeletal:       Per HPI, otherwise negative  Skin: Negative.   Neurological: Positive for weakness. Negative for syncope.    Physical Exam Updated Vital Signs BP (!) 145/92 (BP Location: Right Arm)   Pulse (!) 122   Temp 98 F (36.7 C) (Oral)   Resp 20   Ht 4\' 6"  (1.372 m)   Wt 44.5 kg   SpO2 99%   BMI 23.63 kg/m   Physical Exam Vitals and nursing note reviewed.  Constitutional:      General: She is not in acute distress.    Appearance: She is well-developed. She is ill-appearing.  She is not toxic-appearing.  HENT:     Head: Normocephalic and atraumatic.  Eyes:     Conjunctiva/sclera: Conjunctivae normal.  Cardiovascular:     Rate and Rhythm: Normal rate and regular rhythm.  Pulmonary:     Effort: No respiratory distress.     Breath sounds: Normal breath sounds. No stridor.     Comments: Decreased breath sounds left side.  No rhonchi.  Patient is tachypneic. Abdominal:     General: There is no distension.    Skin:    General: Skin is warm and dry.  Neurological:     Mental Status: She is alert and oriented to person, place, and time.     Cranial Nerves: No cranial nerve deficit.     Comments: Cranial nerves notable for extremely poor hearing, otherwise unremarkable neurologic exam aside from atrophy, age appropriate.     ED Results / Procedures / Treatments   Labs (all labs ordered are listed, but only abnormal results are displayed) Labs Reviewed  COMPREHENSIVE METABOLIC PANEL - Abnormal; Notable for the following components:      Result Value   Sodium 128 (*)    Chloride 89 (*)    Glucose, Bld 100 (*)    Alkaline Phosphatase 133 (*)    All other components within normal limits  CBC WITH DIFFERENTIAL/PLATELET - Abnormal; Notable for the following components:   Hemoglobin 10.7 (*)    HCT 33.9 (*)    MCH 25.8 (*)    All other components within normal limits  URINALYSIS, ROUTINE W REFLEX MICROSCOPIC - Abnormal; Notable for the following components:   Color, Urine STRAW (*)    Hgb urine dipstick SMALL (*)    Ketones, ur 5 (*)    Bacteria, UA RARE (*)    All other components within normal limits  URINE CULTURE  SARS CORONAVIRUS 2 (TAT 6-24 HRS)    EKG None  Radiology DG Ribs Unilateral W/Chest Left  Result Date: 04/08/2021 CLINICAL DATA:  Fall 1 month ago, left-sided rib fracture EXAM: LEFT RIBS AND CHEST - 3+ VIEW COMPARISON:  03/26/2021 FINDINGS: The heart is normal in size. Severe atherosclerosis of the aortic arch. Moderate left pleural  effusion and associated atelectasis or consolidation, slightly increased compared to prior examination. Minimally displaced lower left rib fractures, partially callused. Multilevel kyphoplasty of the thoracic and lumbar spine. IMPRESSION: 1. Moderate left pleural effusion and associated atelectasis or consolidation, slightly increased compared to prior examination. 2. Minimally displaced lower left rib fractures, partially callused. Electronically Signed   By: Eddie Candle M.D.   On: 04/08/2021 14:58   CT Chest  Wo Contrast  Result Date: 04/08/2021 CLINICAL DATA:  Left back and flank pain. EXAM: CT CHEST WITHOUT CONTRAST TECHNIQUE: Multidetector CT imaging of the chest was performed following the standard protocol without IV contrast. COMPARISON:  Chest radiograph 04/08/2021 and thoracic spine examination 03/26/2021 and thoracic spine MRI 12/27/2020 and abdominal CT 09/17/2020 FINDINGS: Cardiovascular: Thoracic aorta is heavily calcified. Normal caliber of the thoracic aorta. Coronary artery calcifications. Mediastinum/Nodes: No significant chest lymphadenopathy but limited evaluation of the left hilum due to volume loss in left hemithorax. Lack of intravascular contrast also limits evaluation of the mediastinal structures. Lungs/Pleura: Trachea and mainstem bronchi are patent. Small to moderate amount of loculated left pleural fluid. Volume loss throughout the left lower lobe. Mild scarring at the right lung apex. Trace right pleural fluid. Upper Abdomen: Enlargement of the renal pelvis bilaterally. Evidence for at least mild hydronephrosis. The hydronephrosis is new since 09/17/2020. Limited evaluation of the abdomen without intravascular contrast. Musculoskeletal: Numerous old right rib fractures. Numerous old left rib fractures. Acute displaced fractures involving the posterior left eighth rib. Severely displaced fracture involving the posterior left ninth rib just posterior to the descending thoracic aorta.  Segmental fractures involving the posterior left tenth rib posterior to the thoracic aorta. Displaced fracture involving the left eleventh rib. Displaced fracture involving the posterior left seventh rib. Severe kyphosis of the thoracic spine. Normal alignment at the cervicothoracic junction. There is a bone cement within the T8 vertebral body, T11 and T12 vertebral bodies. There are old compression fractures at T6, T7, T8, T10, T11 and T12. There also appears to be an old compression fracture involving superior endplate at L1. IMPRESSION: 1. Acute displaced fractures involving left posterior ribs (7th-11th ribs). 2. Small to moderate sized loculated left pleural effusion. Limited evaluation of the pleural fluid without intravascular contrast. 3. At least mild hydronephrosis bilaterally. Recommend further characterization with renal ultrasound or CT of the abdomen and pelvis. 4. Small amount of right pleural fluid. 5. Volume loss in the left lower lobe. 6. Multiple vertebral body compression fractures. These fractures have not significant changed since MRI on 12/27/2020. Electronically Signed   By: Markus Daft M.D.   On: 04/08/2021 16:59    Procedures Procedures   Medications Ordered in ED Medications  fentaNYL (SUBLIMAZE) injection 12.5 mcg (has no administration in time range)  sodium chloride 0.9 % bolus 1,000 mL (0 mLs Intravenous Stopped 04/08/21 1616)    ED Course  I have reviewed the triage vital signs and the nursing notes.  Pertinent labs & imaging results that were available during my care of the patient were reviewed by me and considered in my medical decision making (see chart for details).    5:10 PM On repeat exam the patient is still complaining of pain.  She has received fentanyl.  At a conversation with her and her husband about pain management is unclear what she has been taking at home, but it seems that she has been taking several medications with no relief. I reviewed the CT  scan, and I discussed it with her and her husband. Patient found to have 5 rib fractures.  Seemingly the fall was about 1 month ago, but with persistent pleural effusion, pain in that area, patient be admitted for ongoing analgesia, consideration of interventions, no indication for emergent ED procedure. With mild hydronephrosis, ultrasound renal pending.  Final Clinical Impression(s) / ED Diagnoses Final diagnoses:  Closed fracture of multiple ribs of left side, initial encounter  Pleural effusion  Carmin Muskrat, MD 04/08/21 [phone]

## 2021-04-08 NOTE — ED Triage Notes (Signed)
Pt reports pain in her left flank since last night. She has hx of fall and left rib fracture 1 month ago.

## 2021-04-08 NOTE — Progress Notes (Signed)
Attempted to do pt's nursing admission history. Pt extremely hoh and could not understand the questions. She did give permission to call her husband at home. I attempted to call him and he does not answer the phone. Pt's nurse will need to complete remaining items on nursing admission history if family arrives. Lucius Conn BSN, RN-BC Admissions RN 04/08/2021 8:10 PM

## 2021-04-09 DIAGNOSIS — Z90710 Acquired absence of both cervix and uterus: Secondary | ICD-10-CM | POA: Diagnosis not present

## 2021-04-09 DIAGNOSIS — Z87440 Personal history of urinary (tract) infections: Secondary | ICD-10-CM | POA: Diagnosis not present

## 2021-04-09 DIAGNOSIS — E861 Hypovolemia: Secondary | ICD-10-CM | POA: Diagnosis present

## 2021-04-09 DIAGNOSIS — R296 Repeated falls: Secondary | ICD-10-CM | POA: Diagnosis present

## 2021-04-09 DIAGNOSIS — N1330 Unspecified hydronephrosis: Secondary | ICD-10-CM | POA: Diagnosis present

## 2021-04-09 DIAGNOSIS — M810 Age-related osteoporosis without current pathological fracture: Secondary | ICD-10-CM | POA: Diagnosis present

## 2021-04-09 DIAGNOSIS — E039 Hypothyroidism, unspecified: Secondary | ICD-10-CM

## 2021-04-09 DIAGNOSIS — Z20822 Contact with and (suspected) exposure to covid-19: Secondary | ICD-10-CM | POA: Diagnosis present

## 2021-04-09 DIAGNOSIS — D638 Anemia in other chronic diseases classified elsewhere: Secondary | ICD-10-CM | POA: Diagnosis present

## 2021-04-09 DIAGNOSIS — E876 Hypokalemia: Secondary | ICD-10-CM | POA: Diagnosis present

## 2021-04-09 DIAGNOSIS — M1990 Unspecified osteoarthritis, unspecified site: Secondary | ICD-10-CM | POA: Diagnosis present

## 2021-04-09 DIAGNOSIS — S2249XA Multiple fractures of ribs, unspecified side, initial encounter for closed fracture: Secondary | ICD-10-CM | POA: Diagnosis present

## 2021-04-09 DIAGNOSIS — E873 Alkalosis: Secondary | ICD-10-CM | POA: Diagnosis present

## 2021-04-09 DIAGNOSIS — Z79899 Other long term (current) drug therapy: Secondary | ICD-10-CM | POA: Diagnosis not present

## 2021-04-09 DIAGNOSIS — Z85850 Personal history of malignant neoplasm of thyroid: Secondary | ICD-10-CM | POA: Diagnosis not present

## 2021-04-09 DIAGNOSIS — S2242XA Multiple fractures of ribs, left side, initial encounter for closed fracture: Principal | ICD-10-CM

## 2021-04-09 DIAGNOSIS — I10 Essential (primary) hypertension: Secondary | ICD-10-CM | POA: Diagnosis present

## 2021-04-09 DIAGNOSIS — W19XXXA Unspecified fall, initial encounter: Secondary | ICD-10-CM | POA: Diagnosis present

## 2021-04-09 DIAGNOSIS — J90 Pleural effusion, not elsewhere classified: Secondary | ICD-10-CM | POA: Diagnosis present

## 2021-04-09 DIAGNOSIS — H9190 Unspecified hearing loss, unspecified ear: Secondary | ICD-10-CM | POA: Diagnosis not present

## 2021-04-09 DIAGNOSIS — R531 Weakness: Secondary | ICD-10-CM | POA: Diagnosis present

## 2021-04-09 DIAGNOSIS — R338 Other retention of urine: Secondary | ICD-10-CM | POA: Diagnosis not present

## 2021-04-09 DIAGNOSIS — K219 Gastro-esophageal reflux disease without esophagitis: Secondary | ICD-10-CM | POA: Diagnosis present

## 2021-04-09 DIAGNOSIS — N390 Urinary tract infection, site not specified: Secondary | ICD-10-CM | POA: Diagnosis not present

## 2021-04-09 DIAGNOSIS — R339 Retention of urine, unspecified: Secondary | ICD-10-CM | POA: Diagnosis present

## 2021-04-09 DIAGNOSIS — M40204 Unspecified kyphosis, thoracic region: Secondary | ICD-10-CM | POA: Diagnosis present

## 2021-04-09 DIAGNOSIS — Z79890 Hormone replacement therapy (postmenopausal): Secondary | ICD-10-CM | POA: Diagnosis not present

## 2021-04-09 DIAGNOSIS — E860 Dehydration: Secondary | ICD-10-CM | POA: Diagnosis present

## 2021-04-09 DIAGNOSIS — E871 Hypo-osmolality and hyponatremia: Secondary | ICD-10-CM | POA: Diagnosis present

## 2021-04-09 LAB — BASIC METABOLIC PANEL
Anion gap: 6 (ref 5–15)
BUN: 15 mg/dL (ref 8–23)
CO2: 29 mmol/L (ref 22–32)
Calcium: 8.9 mg/dL (ref 8.9–10.3)
Chloride: 94 mmol/L — ABNORMAL LOW (ref 98–111)
Creatinine, Ser: 0.52 mg/dL (ref 0.44–1.00)
GFR, Estimated: >60 mL/min (ref >60)
Glucose, Bld: 114 mg/dL — ABNORMAL HIGH (ref 70–99)
Potassium: 3.2 mmol/L — ABNORMAL LOW (ref 3.5–5.1)
Sodium: 129 mmol/L — ABNORMAL LOW (ref 135–145)

## 2021-04-09 LAB — SARS CORONAVIRUS 2 (TAT 6-24 HRS): SARS Coronavirus 2: NEGATIVE

## 2021-04-09 MED ORDER — POTASSIUM CHLORIDE 10 MEQ/100ML IV SOLN
10.0000 meq | INTRAVENOUS | Status: AC
  Administered 2021-04-09 (×4): 10 meq via INTRAVENOUS
  Filled 2021-04-09 (×4): qty 100

## 2021-04-09 MED ORDER — HYDRALAZINE HCL 25 MG PO TABS
25.0000 mg | ORAL_TABLET | ORAL | Status: DC | PRN
  Administered 2021-04-10: 25 mg via ORAL
  Filled 2021-04-09: qty 1

## 2021-04-09 MED ORDER — POTASSIUM CHLORIDE 10 MEQ/100ML IV SOLN
10.0000 meq | INTRAVENOUS | Status: AC
  Administered 2021-04-09 (×2): 10 meq via INTRAVENOUS
  Filled 2021-04-09 (×2): qty 100

## 2021-04-09 MED ORDER — LABETALOL HCL 5 MG/ML IV SOLN
10.0000 mg | INTRAVENOUS | Status: DC | PRN
  Administered 2021-04-09: 10 mg via INTRAVENOUS
  Filled 2021-04-09: qty 4

## 2021-04-09 MED ORDER — AMLODIPINE BESYLATE 10 MG PO TABS
10.0000 mg | ORAL_TABLET | Freq: Every day | ORAL | Status: DC
  Administered 2021-04-09 – 2021-04-14 (×6): 10 mg via ORAL
  Filled 2021-04-09 (×6): qty 1

## 2021-04-09 NOTE — Evaluation (Signed)
Occupational Therapy Evaluation Patient Details Name: Donna Hill MRN: [id number] DOB: 1934/09/05 Today's Date: 04/09/2021    History of Present Illness Patient is an 87 year old female admitted with lower abdominal pain and urinary retention. Was treated for UTI ~1 month ago and completed course of antibiotics. PMH includes osteoarthritis, GERD, hypertension, osteoporosis and hypoacusis   Clinical Impression   Patient lives with spouse in a single level house with ramp entry. Patient reports at baseline spouse does assist with dressing, bathing but that she can use toilet herself. Uses a rollator for ambulation. Does make statement "my husband has a harder time getting around than me." Unsure if other family/friends come by to assist patient/spouse. Currently patient mod I with bed mobility and min A for safety for stand pivot to bedside commode, mod A for toileting tasks due to balance and then min A to ambulate with rolling walker to recliner needing min cues for sequencing. Patient may have been more supervision level with appropriate height rollator vs 2 wheeled walker as she had some difficulty turning it at times. If spouse is able to assist patient at home would recommend D/C home with home health services, acute OT to follow.    Follow Up Recommendations  Home health OT;Supervision/Assistance - 24 hour;Other (comment) (is spouse able to provide current assist levels, if not SNF)    Equipment Recommendations  None recommended by OT       Precautions / Restrictions Precautions Precautions: Fall Precaution Comments: VERY HOH Restrictions Weight Bearing Restrictions: No      Mobility Bed Mobility Overal bed mobility: Modified Independent             General bed mobility comments: HOB elevated    Transfers Overall transfer level: Needs assistance Equipment used: Rolling walker (2 wheeled);1 person hand held assist Transfers: Sit to/from [facility] Sit  to Stand: Min assist Stand pivot transfers: Min assist       General transfer comment: min A for safety, did not have rollator present for ambulation which patient is more used to also walker height still too tall on lowest setting as patient is 4'6.    Balance Overall balance assessment: Needs assistance Sitting-balance support: Feet supported Sitting balance-Leahy Scale: Good     Standing balance support: Bilateral upper extremity supported Standing balance-Leahy Scale: Poor Standing balance comment: reliant on UE support                           ADL either performed or assessed with clinical judgement   ADL Overall ADL's : Needs assistance/impaired Eating/Feeding: Set up;Bed level Eating/Feeding Details (indicate cue type and reason): upon arrival patient sitting up in bed eating breakfast without difficulty Grooming: Wash/dry face;Wash/dry hands;Brushing hair;Sitting   Upper Body Bathing: Supervision/ safety;Set up;Sitting   Lower Body Bathing: Moderate assistance;Sitting/lateral leans;Sit to/from stand   Upper Body Dressing : Set up;Supervision/safety;Sitting   Lower Body Dressing: Total assistance;Sitting/lateral leans Lower Body Dressing Details (indicate cue type and reason): to don socks, patient reports her spouse assists at baseline however sitting in recliner patient was able to adjust her sock Toilet Transfer: Minimal assistance;Stand-pivot;Cueing for safety;BSC Toilet Transfer Details (indicate cue type and reason): hand held assist and min cues for safety to pivot to bedside commode, mildly unsteady Toileting- Clothing Manipulation and Hygiene: Moderate assistance;Sitting/lateral lean;Sit to/from stand Toileting - Clothing Manipulation Details (indicate cue type and reason): to manage mesh underwear     Functional mobility  during ADLs: Minimal assistance;Rolling walker General ADL Comments: patient appears close to her reported baseline with self  care, min A for safety ambulating with rolling walker to recliner chair ~21ft patient more used to using 4WW                  Pertinent Vitals/Pain Pain Assessment: Faces Faces Pain Scale: No hurt     Hand Dominance Right   Extremity/Trunk Assessment Upper Extremity Assessment Upper Extremity Assessment: Generalized weakness   Lower Extremity Assessment Lower Extremity Assessment: Defer to PT evaluation   Cervical / Trunk Assessment Cervical / Trunk Assessment: Kyphotic   Communication Communication Communication: HOH   Cognition Arousal/Alertness: Awake/alert Behavior During Therapy: WFL for tasks assessed/performed Overall Cognitive Status: Within Functional Limits for tasks assessed                                 General Comments: patient is grossly oriented              Home Living Family/patient expects to be discharged to:: Private residence Living Arrangements: Spouse/significant other Available Help at Discharge: Family Type of Home: House Home Access: Ramped entrance     Home Layout: One level     Bathroom Shower/Tub: Tub only;Other (comment) (shower stall)   Bathroom Toilet: Standard     Home Equipment: Walker - 4 wheels;Bedside commode          Prior Functioning/Environment Level of Independence: Needs assistance  Gait / Transfers Assistance Needed: reports ambulates with a 4WW ADL's / Homemaking Assistance Needed: patient reports spouse assists with dressing, bathing, reports can toilet herself   Comments: patient stating "my husband has a harder time getting around than I do"        OT Problem List: Decreased activity tolerance;Impaired balance (sitting and/or standing);Decreased safety awareness      OT Treatment/Interventions: Self-care/ADL training;DME and/or AE instruction;Therapeutic activities;Patient/family education;Balance training    OT Goals(Current goals can be found in the care plan section) Acute  Rehab OT Goals Patient Stated Goal: "I need to use the bathroom" OT Goal Formulation: With patient Time For Goal Achievement: 04/23/21 Potential to Achieve Goals: Good  OT Frequency: Min 2X/week    AM-PAC OT "6 Clicks" Daily Activity     Outcome Measure Help from another person eating meals?: A Little Help from another person taking care of personal grooming?: A Little Help from another person toileting, which includes using toliet, bedpan, or urinal?: A Lot Help from another person bathing (including washing, rinsing, drying)?: A Lot Help from another person to put on and taking off regular upper body clothing?: A Little Help from another person to put on and taking off regular lower body clothing?: A Lot 6 Click Score: 15   End of Session Equipment Utilized During Treatment: Rolling walker Nurse Communication: Mobility status  Activity Tolerance: Patient tolerated treatment well Patient left: in chair;with call bell/phone within reach;with chair alarm set  OT Visit Diagnosis: Unsteadiness on feet (R26.81)                Time: 1443-1540 OT Time Calculation (min): 39 min Charges:  OT General Charges $OT Visit: 1 Visit OT Evaluation $OT Eval Low Complexity: 1 Low OT Treatments $Self Care/Home Management : 23-37 mins  Delbert Phenix OT OT pager: Grace 04/09/2021, 9:53 AM

## 2021-04-09 NOTE — TOC Initial Note (Signed)
Transition of Care [facility]) - Initial/Assessment Note    Patient Details  Name: Donna Hill MRN: [id number] Date of Birth: 1934-03-27  [facility]) CM/SW Contact:    Donna Mage, LCSW Phone Number: 04/09/2021, 3:50 PM  Clinical Narrative:  Patient seen in follow up to PT recommendation of SNF.  Donna Hill is hard of hearing, appeared to be grumpy and between the two I was not able to get a clear idea of where we stood in terms of rehab.  I called Donna Hill, daughter, at the below number [cell phone] and she let me know she thinks it is her in her mother's best interest to go to short term rehab, but it will be a hard sell.  Donna Hill lives at home with her husband, who has his own medical issues.  Donna Hill will come in later this evening to talk to her mother, and we will check in in the morning to proceed with plans. TOC will continue to follow during the course of hospitalization.                 Expected Discharge Plan: Skilled Nursing Facility Barriers to Discharge: SNF Pending bed offer   Patient Goals and CMS Choice     Choice offered to / list presented to : Donna Hill  Expected Discharge Plan and Services Expected Discharge Plan: [place]   Discharge Planning Services: CM Consult Post Acute Care Choice: [place] Living arrangements for the past 2 months: Single Family Home                                      Prior Living Arrangements/Services Living arrangements for the past 2 months: Single Family Home Lives with:: Spouse Patient language and need for interpreter reviewed:: Yes        Need for Family Participation in Patient Care: Yes (Comment) Care giver support system in place?: Yes (comment)   Criminal Activity/Legal Involvement Pertinent to Current Situation/Hospitalization: No - Comment as needed  Activities of Daily Living Home Assistive Devices/Equipment: Other (Comment) (pt very hoh and unable  to answer, husband will not answer the phone when called) ADL Screening (condition at time of admission) Patient's cognitive ability adequate to safely complete daily activities?: No Is the patient deaf or have difficulty hearing?: Yes Does the patient have difficulty seeing, even when wearing glasses/contacts?: Yes Does the patient have difficulty concentrating, remembering, or making decisions?: Yes Patient able to express need for assistance with ADLs?: Yes Does the patient have difficulty dressing or bathing?: Yes Independently performs ADLs?: No Communication: Independent Dressing (OT): Needs assistance Is this a change from baseline?: Pre-admission baseline Grooming: Needs assistance Is this a change from baseline?: Pre-admission baseline Feeding: Independent Bathing: Needs assistance Is this a change from baseline?: Pre-admission baseline Toileting: Needs assistance Is this a change from baseline?: Pre-admission baseline In/Out Bed: Needs assistance Is this a change from baseline?: Pre-admission baseline Walks in Home: Independent Is this a change from baseline?: Pre-admission baseline Does the patient have difficulty walking or climbing stairs?: Yes Weakness of Legs: Both Weakness of Arms/Hands: Both  Permission Sought/Granted Permission sought to share information with : Family Supports Permission granted to share information with : Yes, Verbal Permission Granted  Share Information with NAME: Donna Hill, daughter, 39 [phone]           Emotional Assessment Appearance:: Appears  stated age     Orientation: : Oriented to Self,Oriented to Place Alcohol / Substance Use: Not Applicable Psych Involvement: No (comment)  Admission diagnosis:  Urinary retention [R33.9] Pleural effusion [J90] Closed fracture of multiple ribs of left side, initial encounter [S22.42XA] Multiple rib fractures [S22.49XA] Patient Active Problem List   Diagnosis Date Noted  . Multiple rib  fractures 04/09/2021  . Pleural effusion 04/08/2021  . Acute urinary retention 04/08/2021  . 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 Urinary retention 04/08/2021  . Hypertension 09/11/2012  . Hypothyroidism 09/11/2012  . Osteoporosis 09/11/2012  . GERD (gastroesophageal reflux disease) 09/11/2012   PCP:  Donna Christians, MD Pharmacy:   [facility] [place] [facility] [address] [place] [address] Phone: [phone] Fax: [phone]  [facility] [place] [place] [address] Phone: [phone] Fax: [phone]     Social Determinants of Health (SDOH) Interventions    Readmission Risk Interventions No flowsheet data found.

## 2021-04-09 NOTE — Evaluation (Signed)
Physical Therapy Evaluation Patient Details Name: Donna Hill MRN: [id number] DOB: 07-20-1934 Today's Date: 04/09/2021   History of Present Illness  Patient is an 87 year old female admitted with lower abdominal pain and urinary retention. Was treated for UTI ~1 month ago and completed course of antibiotics. PMH includes osteoarthritis, GERD, hypertension, osteoporosis and hypoacusis  Clinical Impression  Pt admitted with above diagnosis.  Pt currently with functional limitations due to the deficits listed below (see PT Problem List). Pt will benefit from skilled PT to increase their independence and safety with mobility to allow discharge to the venue listed below.  Pt requesting to urinate and attempted to ambulate to bathroom however too fatigued and required BSC.  Pt reports living with her husband and using rollator at baseline.  Pt will likely need more assist upon d/c.  Recommend SNF unless pt has 24/7 assist at home and/or increased care (such as aide).      Follow Up Recommendations SNF    Equipment Recommendations  None recommended by PT    Recommendations for Other Services       Precautions / Restrictions Precautions Precautions: Fall Precaution Comments: VERY HOH Restrictions Weight Bearing Restrictions: No      Mobility  Bed Mobility Overal bed mobility: Modified Independent             General bed mobility comments: HOB elevated    Transfers Overall transfer level: Needs assistance Equipment used: Rolling walker (2 wheeled) Transfers: Sit to/from Stand Sit to Stand: Min assist Stand pivot transfers: Min assist       General transfer comment: assist to rise and steady, typically uses rollator at home  Ambulation/Gait Ambulation/Gait assistance: Min assist Gait Distance (Feet): 3 Feet Assistive device: Rolling walker (2 wheeled) Gait Pattern/deviations: Step-through pattern;Decreased stride length     General Gait Details: attempted to  ambulate to bathroom however pt fatigued and requested BSC instead  Stairs            Wheelchair Mobility    Modified Rankin (Stroke Patients Only)       Balance Overall balance assessment: Needs assistance Sitting-balance support: Feet supported Sitting balance-Leahy Scale: Good     Standing balance support: Bilateral upper extremity supported Standing balance-Leahy Scale: Poor Standing balance comment: reliant on UE support                             Pertinent Vitals/Pain Pain Assessment: No/denies pain Faces Pain Scale: No hurt    Home Living Family/patient expects to be discharged to:: Private residence Living Arrangements: Spouse/significant other Available Help at Discharge: Family Type of Home: House Home Access: Ramped entrance     Home Layout: One level Home Equipment: Environmental consultant - 4 wheels;Bedside commode      Prior Function Level of Independence: Needs assistance   Gait / Transfers Assistance Needed: reports ambulates with a 4WW  ADL's / Homemaking Assistance Needed: patient reports spouse assists with dressing, bathing, reports can toilet herself  Comments: patient stating "my husband has a harder time getting around than I do"     Hand Dominance   Dominant Hand: Right    Extremity/Trunk Assessment   Upper Extremity Assessment Upper Extremity Assessment: Generalized weakness    Lower Extremity Assessment Lower Extremity Assessment: Generalized weakness    Cervical / Trunk Assessment Cervical / Trunk Assessment: Kyphotic  Communication   Communication: HOH  Cognition Arousal/Alertness: Awake/alert Behavior During Therapy: WFL for tasks assessed/performed  Overall Cognitive Status: Within Functional Limits for tasks assessed                                 General Comments: patient is grossly oriented      General Comments      Exercises     Assessment/Plan    PT Assessment Patient needs continued PT  services  PT Problem List Decreased strength;Decreased activity tolerance;Decreased balance;Decreased knowledge of use of DME;Decreased mobility       PT Treatment Interventions DME instruction;Gait training;Balance training;Therapeutic exercise;Functional mobility training;Therapeutic activities;Patient/family education    PT Goals (Current goals can be found in the Care Plan section)  Acute Rehab PT Goals Patient Stated Goal: "I need to use the bathroom" PT Goal Formulation: With patient Time For Goal Achievement: 04/23/21 Potential to Achieve Goals: Good    Frequency Min 2X/week   Barriers to discharge        Co-evaluation               AM-PAC PT "6 Clicks" Mobility  Outcome Measure Help needed turning from your back to your side while in a flat bed without using bedrails?: None Help needed moving from lying on your back to sitting on the side of a flat bed without using bedrails?: A Little Help needed moving to and from a bed to a chair (including a wheelchair)?: A Little Help needed standing up from a chair using your arms (e.g., wheelchair or bedside chair)?: A Little Help needed to walk in hospital room?: A Lot Help needed climbing 3-5 steps with a railing? : A Lot 6 Click Score: 17    End of Session Equipment Utilized During Treatment: Gait belt Activity Tolerance: Patient limited by fatigue Patient left: in bed;with call bell/phone within reach;with bed alarm set Nurse Communication: Mobility status PT Visit Diagnosis: Other abnormalities of gait and mobility (R26.89);Muscle weakness (generalized) (M62.81)    Time: 0272-5366 PT Time Calculation (min) (ACUTE ONLY): 14 min   Charges:   PT Evaluation $PT Eval Low Complexity: 1 Low     Kati PT, DPT Acute Rehabilitation Services Pager: [phone] Office: [phone]   Breshae Belcher,KATHrine E 04/09/2021, 11:56 AM

## 2021-04-09 NOTE — Progress Notes (Signed)
PROGRESS NOTE    Walsie Smeltz  LPF:[id number] DOB: 01/28/1934 DOA: 04/08/2021 PCP: Red Christians, MD    Brief Narrative:  Mrs. Limberg was admitted to the hospital with a working diagnosis of urinary retention, complicated with multiple rib fractures and dehydration.   87 year old female with severe hypoacusis who presented with lower abdominal pain for about 4 weeks, clinically worsening, in the setting of recent urinary tract infection.  Denies any dyspnea or chest pain.  On her initial physical examination blood pressure 145/92, heart rate 122, respiratory rate 20, oxygen saturation 99% on room air.  She had mild pallor, dry mucous membranes, her lungs with decreased breath sounds at bases bilaterally, heart S1-S2, present, rhythmic, soft abdomen, mildly distended, nontender, no lower extremity edema.   Sodium 128, potassium 4.0, chloride 89, bicarb 28, glucose 100, BUN 21, creatinine 0.57, white count 7.7, hemoglobin 10.7, hematocrit 33.9, platelets 289. SARS COVID-19 pending  Urinalysis specific gravity 1.008, negative proteins, negative nitrates, 0-5 red cells, 0-5 white cells.  Chest radiograph with left rotation, left pleural effusion, moderate.  CT chest with acute displaced fractures involving the left posterior ribs, 7-11.  Small to moderate size loculated left pleural effusion.  Mild bilateral hydronephrosis.  Small right pleural effusion.  Multiple vertebral body compression fractures, chronic.  Patient placed in IV fluids and as needed analgesics.  Continue to be very weak and deconditioned, not yet back to baseline.  Physical therapy has recommended SNF placement.   Assessment & Plan:   Principal Problem:   Acute urinary retention Active Problems:   Hypertension   Hypothyroidism   Osteoporosis   GERD (gastroesophageal reflux disease)   Pleural effusion   Hypoacusis   Urinary retention    1.  Acute urinary retention.   ED bladder scan showed about 3000  mL of retained urine, after catheterization more than that 1000 mL voided with significant improvement of her symptoms.  Urine analysis with no pyuria.  Continue to have lower abdominal pain but improved in intensity, close bladder scan monitoring with no signs of further retention.  She continue to be very weak and deconditioned. PT/OT have recommended SNF placement.   2.  Left pleural loculated effusion/ severe kyphosis.  oxygenation 93% on room air, left pleural effusion with loculation, likely chronic. No clinical signs of pulmonary infection. Continue close monitoring of oxygenation.   3.  Hypovolemic hyponatremia/ hypokalemia/ contraction metabolic alkalosis.  Sodium continue to be low at 129, with K at 3,2 and serum bicarbonate at 29 with cr at 0,52 and BUN of 15.   Continue IV fluids with isotonic saline at 75 ml per H, add 60 meq IV kcl and check Mg in am. Avoid hypotension and nephrotoxic medications.   4.  Hypertension.  blood pressure this am 157/72 and 170/82, at home patient with benicar-HCT, bisoprolol-HCTZ- spironolactone.  Will avoid diuretic therapy for now in the setting of hyponatremia and hypokalemia. Start patient on amlodipine 10 mg daily.   5.  Hypothyroidism.  On levothyroxine.  6.  Acute left rib fractures, 7-11, chronic thoracic compression fractures.   Pain control with low dose ibuprofen and acetaminophen. Patient very weak and deconditioned, high fall risk. Continue PT and OT, will need SNF   Consult nutrition for further evaluation.   Patient continue to be at high risk for worsening hyponatremia, hypokalemia and recurrent fractures.   Status is: Observation  The patient will require care spanning > 2 midnights and should be moved to inpatient because: IV treatments  appropriate due to intensity of illness or inability to take PO  Dispo: The patient is from: Home              Anticipated d/c is to: SNF              Patient currently is not  medically stable to d/c.   Difficult to place patient No   DVT prophylaxis: Enoxaparin   Code Status:   full  Family Communication:  I was not able to reach her daughter over the phone, left a message and will call again later.     Subjective: Patient continue to be very weak and deconditioned, positive left chest wall pain, worse with movement, no nausea or vomiting.   Objective: Vitals:   04/08/21 2300 04/09/21 0300 04/09/21 0609 04/09/21 1332  BP: 131/69 125/71 (!) 157/72 (!) 170/82  Pulse: 73 80 79 94  Resp: (!) 21 19 17 20   Temp: 97.8 F (36.6 C) 97.9 F (36.6 C) 98 F (36.7 C) 98 F (36.7 C)  TempSrc: Oral Oral Oral Oral  SpO2: 99% 96% 95% 93%  Weight:      Height:        Intake/Output Summary (Last 24 hours) at 04/09/2021 1357 Last data filed at 04/09/2021 0600 Gross per 24 hour  Intake 2455 ml  Output 1500 ml  Net 955 ml   Filed Weights   04/08/21 1324  Weight: 44.5 kg    Examination:   General: Not in pain or dyspnea, deconditioned  Neurology: Awake and alert, non focal  E ENT: mild pallor, no icterus, oral mucosa dry Cardiovascular: No JVD. S1-S2 present, rhythmic, no gallops, rubs, or murmurs. No lower extremity edema. Pulmonary: positive breath sounds bilaterally, decreased inspiratory effort with wheezing, rhonchi or rales. Gastrointestinal. Abdomen soft and non tender Skin. No rashes Musculoskeletal: no joint deformities     Data Reviewed: I have personally reviewed following labs and imaging studies  CBC: Recent Labs  Lab 04/08/21 1426  WBC 7.7  NEUTROABS 6.0  HGB 10.7*  HCT 33.9*  MCV 81.9  PLT 983   Basic Metabolic Panel: Recent Labs  Lab 04/08/21 1426 04/09/21 0506  NA 128* 129*  K 4.0 3.2*  CL 89* 94*  CO2 28 29  GLUCOSE 100* 114*  BUN 21 15  CREATININE 0.57 0.52  CALCIUM 9.9 8.9   GFR: Estimated Creatinine Clearance: 29.9 mL/min (by C-G formula based on SCr of 0.52 mg/dL). Liver Function Tests: Recent Labs  Lab  04/08/21 1426  AST 21  ALT 13  ALKPHOS 133*  BILITOT 1.1  PROT 6.6  ALBUMIN 3.8   No results for input(s): LIPASE, AMYLASE in the last 168 hours. No results for input(s): AMMONIA in the last 168 hours. Coagulation Profile: No results for input(s): INR, PROTIME in the last 168 hours. Cardiac Enzymes: No results for input(s): CKTOTAL, CKMB, CKMBINDEX, TROPONINI in the last 168 hours. BNP (last 3 results) No results for input(s): PROBNP in the last 8760 hours. HbA1C: No results for input(s): HGBA1C in the last 72 hours. CBG: No results for input(s): GLUCAP in the last 168 hours. Lipid Profile: No results for input(s): CHOL, HDL, LDLCALC, TRIG, CHOLHDL, LDLDIRECT in the last 72 hours. Thyroid Function Tests: No results for input(s): TSH, T4TOTAL, FREET4, T3FREE, THYROIDAB in the last 72 hours. Anemia Panel: No results for input(s): VITAMINB12, FOLATE, FERRITIN, TIBC, IRON, RETICCTPCT in the last 72 hours.    Radiology Studies: I have reviewed all of the imaging during  this hospital visit personally     Scheduled Meds: . enoxaparin (LOVENOX) injection  30 mg Subcutaneous Q24H  . fentaNYL (SUBLIMAZE) injection  12.5 mcg Intravenous Once  . levothyroxine  137 mcg Oral Q0600   Continuous Infusions: . dextrose 5 % and 0.9% NaCl 75 mL/hr at 04/09/21 1009     LOS: 1 day        Benjerman Molinelli Gerome Apley, MD

## 2021-04-09 NOTE — Progress Notes (Signed)
Pt c/o inability to urinate, has purewick on with no output and no incontinence. Pt bladder scan shows >500. Attending notified. Received verbal order to in and out cath and recheck in 4 hours. In and out  550 mls. Will continue to monitor.

## 2021-04-10 ENCOUNTER — Encounter (HOSPITAL_COMMUNITY): Payer: Self-pay | Admitting: Internal Medicine

## 2021-04-10 ENCOUNTER — Inpatient Hospital Stay (HOSPITAL_COMMUNITY): Payer: Medicare Other

## 2021-04-10 LAB — URINE CULTURE: Culture: NO GROWTH

## 2021-04-10 LAB — BASIC METABOLIC PANEL
Anion gap: 8 (ref 5–15)
BUN: 15 mg/dL (ref 8–23)
CO2: 26 mmol/L (ref 22–32)
Calcium: 8.9 mg/dL (ref 8.9–10.3)
Chloride: 100 mmol/L (ref 98–111)
Creatinine, Ser: 0.61 mg/dL (ref 0.44–1.00)
GFR, Estimated: >60 mL/min (ref >60)
Glucose, Bld: 108 mg/dL — ABNORMAL HIGH (ref 70–99)
Potassium: 3.4 mmol/L — ABNORMAL LOW (ref 3.5–5.1)
Sodium: 134 mmol/L — ABNORMAL LOW (ref 135–145)

## 2021-04-10 LAB — MAGNESIUM: Magnesium: 1.6 mg/dL — ABNORMAL LOW (ref 1.7–2.4)

## 2021-04-10 MED ORDER — LISINOPRIL 5 MG PO TABS
5.0000 mg | ORAL_TABLET | Freq: Every day | ORAL | Status: DC
  Administered 2021-04-10 – 2021-04-14 (×5): 5 mg via ORAL
  Filled 2021-04-10 (×5): qty 1

## 2021-04-10 MED ORDER — POTASSIUM CHLORIDE CRYS ER 10 MEQ PO TBCR
40.0000 meq | EXTENDED_RELEASE_TABLET | Freq: Once | ORAL | Status: AC
  Administered 2021-04-10: 40 meq via ORAL
  Filled 2021-04-10: qty 4

## 2021-04-10 MED ORDER — MAGNESIUM SULFATE 2 GM/50ML IV SOLN
2.0000 g | Freq: Once | INTRAVENOUS | Status: AC
  Administered 2021-04-10: 2 g via INTRAVENOUS
  Filled 2021-04-10: qty 50

## 2021-04-10 MED ORDER — ENSURE ENLIVE PO LIQD
237.0000 mL | Freq: Two times a day (BID) | ORAL | Status: DC
  Administered 2021-04-11 – 2021-04-14 (×5): 237 mL via ORAL

## 2021-04-10 MED ORDER — FUROSEMIDE 10 MG/ML IJ SOLN
40.0000 mg | Freq: Once | INTRAMUSCULAR | Status: AC
  Administered 2021-04-10: 40 mg via INTRAVENOUS
  Filled 2021-04-10: qty 4

## 2021-04-10 NOTE — Progress Notes (Signed)
PROGRESS NOTE    Donna Hill  MLY:[id number] DOB: Jun 25, 1934 DOA: 04/08/2021 PCP: Red Christians, MD    Brief Narrative:  Mrs. Peggs was admitted to the hospital with a working diagnosis of urinary retention, complicated with multiple rib fractures and dehydration.   87 year old female with severe hypoacusis who presented with lower abdominal pain for about 4 weeks, clinically worsening, in the setting of recent urinary tract infection. Denies any dyspnea or chest pain. On her initial physical examination blood pressure 145/92, heart rate 122, respiratory rate 20, oxygen saturation 99% on room air. She had mild pallor, dry mucous membranes, her lungs with decreased breath sounds at bases bilaterally, heart S1-S2, present, rhythmic, soft abdomen, mildly distended, nontender, no lower extremity edema.  Sodium 128, potassium 4.0, chloride 89, bicarb 28, glucose 100, BUN 21, creatinine 0.57, white count 7.7, hemoglobin 10.7, hematocrit 33.9, platelets 289. SARS COVID-19 pending  Urinalysis specific gravity 1.008, negative proteins, negative nitrates, 0-5 red cells, 0-5 white cells.  Chest radiograph with left rotation, left pleural effusion, moderate.  CT chest with acute displaced fractures involving the left posterior ribs, 7-11. Small to moderate size loculated left pleural effusion. Mild bilateral hydronephrosis. Small right pleural effusion. Multiple vertebral body compression fractures, chronic.  Patient placed in IV fluids and as needed analgesics.  Continue to be very weak and deconditioned, not yet back to baseline.  Physical therapy has recommended SNF placement.    Assessment & Plan:   Principal Problem:   Acute urinary retention Active Problems:   Hypertension   Hypothyroidism   Osteoporosis   GERD (gastroesophageal reflux disease)   Pleural effusion   Hypoacusis   Urinary retention   Multiple rib fractures   1.Acute urinary retention. ED  bladder scan showed about 3000 mL of retained urine, after catheterization more than that 1000 mL voided with significant improvement of her symptoms.  Recurrent urinary tract infection has been ruled out, no further signs of urinary retention.  Continue close monitoring of bladder scans as needed.  Patient continue to be very weak and deconditioned.   2.Left pleural loculated effusion/ severe kyphosis. Continue to have pain at the site of rib fractures, but no dyspnea or hypoxemia. Continue close monitoring.   3.Hypovolemic hyponatremia/ hypokalemia/ contraction metabolic alkalosis.  Po intake has improved, no nausea or vomiting.  Na 134, K is 3,4 and serum bicarbonate at 26, renal functions stable with serum cr at 0,61. Mg low at 1,6.  Plan to discontinue IV fluids and continue to encourage po intake.  Add 40 meq Kcl po and 2 g Mag sulfate IV Diuretic therapy has been discontinued, continue to follow up renal function and electrolytes in am.   4.Hypertension.At home patient with benicar-HCT, bisoprolol-HCTZ- spironolactone. Blood pressure 812-751 mmHg systolic, target for inpatient blood pressure systolic 700 or less.   Continue with amlodipine for blood pressure control, likely patient not candidate for diuretic therapy. If need second agent will use a ace inh.   5.Hypothyroidism.Continue with levothyroxine.  6.Acute left rib fractures, 7-11, chronic thoracic compression fractures. Continue pain control with low dose ibuprofen and acetaminophen. Very weak and deconditioned, plan to transfer to SNF   Status is: Inpatient  Remains inpatient appropriate because:Inpatient level of care appropriate due to severity of illness   Dispo: The patient is from: Home              Anticipated d/c is to: SNF              Patient  currently is not medically stable to d/c.   Difficult to place patient No   DVT prophylaxis: Enoxaparin   Code Status:   full  Family  Communication:   I spoke over the phone with the patient's daughter about patient's  condition, plan of care, prognosis and all questions were addressed.    Subjective: Patient continue to be very weak and deconditioned, continue to have left chest wall pain, worse with movement, no nausea or vomiting, no dyspnea,   Objective: Vitals:   04/09/21 2143 04/09/21 2321 04/10/21 0026 04/10/21 0540  BP: (!) 181/90 (!) 178/79 (!) 174/80 (!) 158/75  Pulse: 79 87 88 81  Resp: 18   18  Temp: 97.9 F (36.6 C)   97.8 F (36.6 C)  TempSrc: Oral   Oral  SpO2: 96%   93%  Weight:      Height:        Intake/Output Summary (Last 24 hours) at 04/10/2021 1131 Last data filed at 04/10/2021 1100 Gross per 24 hour  Intake 3075.13 ml  Output 2452 ml  Net 623.13 ml   Filed Weights   04/08/21 1324  Weight: 44.5 kg    Examination:   General: Not in pain or dyspnea., deconditioned  Neurology: Awake and alert, non focal  E ENT: mild pallor, no icterus, oral mucosa moist Cardiovascular: No JVD. S1-S2 present, rhythmic, no gallops, rubs, or murmurs. No lower extremity edema. Pulmonary: positive breath sounds bilaterally, adequate air movement, no wheezing, rhonchi or rales. Decreases inspiratory effort.  Gastrointestinal. Abdomen soft and non tender Skin. No rashes Musculoskeletal: no joint deformities     Data Reviewed: I have personally reviewed following labs and imaging studies  CBC: Recent Labs  Lab 04/08/21 1426  WBC 7.7  NEUTROABS 6.0  HGB 10.7*  HCT 33.9*  MCV 81.9  PLT 628   Basic Metabolic Panel: Recent Labs  Lab 04/08/21 1426 04/09/21 0506 04/10/21 0525  NA 128* 129* 134*  K 4.0 3.2* 3.4*  CL 89* 94* 100  CO2 28 29 26   GLUCOSE 100* 114* 108*  BUN 21 15 15   CREATININE 0.57 0.52 0.61  CALCIUM 9.9 8.9 8.9  MG  --   --  1.6*   GFR: Estimated Creatinine Clearance: 29.9 mL/min (by C-G formula based on SCr of 0.61 mg/dL). Liver Function Tests: Recent Labs  Lab  04/08/21 1426  AST 21  ALT 13  ALKPHOS 133*  BILITOT 1.1  PROT 6.6  ALBUMIN 3.8   No results for input(s): LIPASE, AMYLASE in the last 168 hours. No results for input(s): AMMONIA in the last 168 hours. Coagulation Profile: No results for input(s): INR, PROTIME in the last 168 hours. Cardiac Enzymes: No results for input(s): CKTOTAL, CKMB, CKMBINDEX, TROPONINI in the last 168 hours. BNP (last 3 results) No results for input(s): PROBNP in the last 8760 hours. HbA1C: No results for input(s): HGBA1C in the last 72 hours. CBG: No results for input(s): GLUCAP in the last 168 hours. Lipid Profile: No results for input(s): CHOL, HDL, LDLCALC, TRIG, CHOLHDL, LDLDIRECT in the last 72 hours. Thyroid Function Tests: No results for input(s): TSH, T4TOTAL, FREET4, T3FREE, THYROIDAB in the last 72 hours. Anemia Panel: No results for input(s): VITAMINB12, FOLATE, FERRITIN, TIBC, IRON, RETICCTPCT in the last 72 hours.    Radiology Studies: I have reviewed all of the imaging during this hospital visit personally     Scheduled Meds: . amLODipine  10 mg Oral Daily  . enoxaparin (LOVENOX) injection  30 mg  Subcutaneous Q24H  . levothyroxine  137 mcg Oral Q0600   Continuous Infusions: . dextrose 5 % and 0.9% NaCl 75 mL/hr at 04/10/21 0614     LOS: 2 days        Demtrius Rounds Gerome Apley, MD

## 2021-04-10 NOTE — NC FL2 (Signed)
[place] LEVEL OF CARE SCREENING TOOL     IDENTIFICATION  Patient Name: Donna Hill Birthdate: January 07, 1934 Sex: female Admission Date (Current Location): 04/08/2021  [facility] and [place] Number:  Herbalist and Address:  [facility],  [address] [place], [place]      Provider Number: [id number]  Attending Physician Name and Address:  Tawni Millers,*  Relative Name and Phone Number:  Concepcion Elk, daughter, [phone]    Current Level of Care: Hospital Recommended Level of Care: [place] Prior Approval Number:    Date Approved/Denied:   PASRR Number: [id number]  Discharge Plan: SNF    Current Diagnoses: Patient Active Problem List   Diagnosis Date Noted  . Multiple rib fractures 04/09/2021  . Pleural effusion 04/08/2021  . Acute urinary retention 04/08/2021  . 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 Urinary retention 04/08/2021  . Hypertension 09/11/2012  . Hypothyroidism 09/11/2012  . Osteoporosis 09/11/2012  . GERD (gastroesophageal reflux disease) 09/11/2012    Orientation RESPIRATION BLADDER Height & Weight     Self,Place  Normal External catheter Weight: 44.5 kg Height:  4\' 6"  (137.2 cm)  BEHAVIORAL SYMPTOMS/MOOD NEUROLOGICAL BOWEL NUTRITION STATUS   (none)  (none) Continent Diet (see d/c summary)  AMBULATORY STATUS COMMUNICATION OF NEEDS Skin   Extensive Assist Verbally Normal                       Personal Care Assistance Level of Assistance  Bathing,Feeding,Dressing Bathing Assistance: Maximum assistance Feeding assistance: Independent Dressing Assistance: Limited assistance     Functional Limitations Info  Sight,Hearing,Speech Sight Info: Adequate Hearing Info: Impaired Speech Info: Adequate    SPECIAL CARE FACTORS FREQUENCY  PT (By licensed PT),OT (By licensed OT)     PT Frequency: 5X/W OT Frequency: 5X/W            Contractures Contractures Info: Not present     Additional Factors Info  Code Status,Allergies Code Status Info: Full Allergies Info: codeine, histamine, meperidine, poison ivy extract           Current Medications (04/10/2021):  This is the current hospital active medication list Current Facility-Administered Medications  Medication Dose Route Frequency Provider Last Rate Last Admin  . acetaminophen (TYLENOL) tablet 650 mg  650 mg Oral Q6H PRN Arrien, Jimmy Picket, MD   650 mg at 04/10/21 0865   Or  . acetaminophen (TYLENOL) suppository 650 mg  650 mg Rectal Q6H PRN Arrien, Jimmy Picket, MD      . amLODipine (NORVASC) tablet 10 mg  10 mg Oral Daily Arrien, Jimmy Picket, MD   10 mg at 04/10/21 1016  . enoxaparin (LOVENOX) injection 30 mg  30 mg Subcutaneous Q24H Tawni Millers, MD   30 mg at 04/09/21 2113  . feeding supplement (ENSURE ENLIVE / ENSURE PLUS) liquid 237 mL  237 mL Oral BID BM Arrien, Jimmy Picket, MD      . hydrALAZINE (APRESOLINE) tablet 25 mg  25 mg Oral Q4H PRN Dwyane Dee, MD   25 mg at 04/10/21 0037  . ibuprofen (ADVIL) tablet 200 mg  200 mg Oral Q6H PRN Arrien, Jimmy Picket, MD   200 mg at 04/09/21 2214  . labetalol (NORMODYNE) injection 10 mg  10 mg Intravenous Q4H PRN Dwyane Dee, MD   10 mg at 04/09/21 2327  . levothyroxine (SYNTHROID) tablet 137 mcg  137 mcg Oral Q0600 Tawni Millers, MD   137 mcg at 04/10/21  2248  . lisinopril (ZESTRIL) tablet 5 mg  5 mg Oral Daily Arrien, Jimmy Picket, MD   5 mg at 04/10/21 1229  . ondansetron (ZOFRAN) tablet 4 mg  4 mg Oral Q6H PRN Arrien, Jimmy Picket, MD       Or  . ondansetron [facility]) injection 4 mg  4 mg Intravenous Q6H PRN Arrien, Jimmy Picket, MD         Discharge Medications: Please see discharge summary for a list of discharge medications.  Relevant Imaging Results:  Relevant Lab Results:   Additional Information [place], [place]

## 2021-04-10 NOTE — Progress Notes (Signed)
Initial Nutrition Assessment  INTERVENTION:   -Ensure Enlive po BID, each supplement provides 350 kcal and 20 grams of protein  NUTRITION DIAGNOSIS:   Increased nutrient needs related to acute illness as evidenced by estimated needs.  GOAL:   Patient will meet greater than or equal to 90% of their needs  MONITOR:   PO intake,Supplement acceptance,Labs,Weight trends,I & O's  REASON FOR ASSESSMENT:   Malnutrition Screening Tool,Consult Assessment of nutrition requirement/status  ASSESSMENT:   87 year old female with severe hypoacusis who presented with lower abdominal pain for about 4 weeks, clinically worsening, in the setting of recent urinary tract infection. Admitted to the hospital with a working diagnosis of urinary retention, complicated with multiple rib fractures and dehydration.  Patient consuming 50-90% of meals today. Very HOH, unable to gather history at this time. Will likely benefit from nutritional supplements, will order Ensure.  Per weight records, pt has lost 4 lbs since August 2020, insignificant.  Medications: KLOR-CON, Mg sulfate  Labs reviewed: Low Na, K, Mg  NUTRITION - FOCUSED PHYSICAL EXAM:  Unable to complete  Diet Order:   Diet Order            Diet Heart Room service appropriate? Yes; Fluid consistency: Thin  Diet effective now                 EDUCATION NEEDS:   No education needs have been identified at this time  Skin:  Skin Assessment: Reviewed RN Assessment  Last BM:  PTA  Height:   Ht Readings from Last 1 Encounters:  04/08/21 4\' 6"  (1.372 m)    Weight:   Wt Readings from Last 1 Encounters:  04/08/21 44.5 kg   BMI:  Body mass index is 23.63 kg/m.  Estimated Nutritional Needs:   Kcal:  1250-1450  Protein:  55-70g  Fluid:  1.5L/day  Donna Bibles, MS, RD, LDN Inpatient Clinical Dietitian Contact information available via Amion

## 2021-04-11 LAB — BASIC METABOLIC PANEL
Anion gap: 9 (ref 5–15)
BUN: 19 mg/dL (ref 8–23)
CO2: 26 mmol/L (ref 22–32)
Calcium: 8.8 mg/dL — ABNORMAL LOW (ref 8.9–10.3)
Chloride: 99 mmol/L (ref 98–111)
Creatinine, Ser: 0.69 mg/dL (ref 0.44–1.00)
GFR, Estimated: >60 mL/min (ref >60)
Glucose, Bld: 98 mg/dL (ref 70–99)
Potassium: 3.9 mmol/L (ref 3.5–5.1)
Sodium: 134 mmol/L — ABNORMAL LOW (ref 135–145)

## 2021-04-11 LAB — MAGNESIUM: Magnesium: 1.8 mg/dL (ref 1.7–2.4)

## 2021-04-11 MED ORDER — POLYETHYLENE GLYCOL 3350 17 G PO PACK
17.0000 g | PACK | Freq: Two times a day (BID) | ORAL | Status: DC
  Administered 2021-04-11 – 2021-04-13 (×4): 17 g via ORAL
  Filled 2021-04-11 (×6): qty 1

## 2021-04-11 NOTE — Care Management Important Message (Signed)
Important Message  Patient Details IM Letter given to the Patient. Name: Donna Hill MRN: [id number] Date of Birth: 07-Dec-1933   Medicare Important Message Given:  Yes     Kerin Salen 04/11/2021, 9:48 AM

## 2021-04-11 NOTE — Progress Notes (Signed)
PROGRESS NOTE    Donna Hill  OZH:[id number] DOB: 01-31-1934 DOA: 04/08/2021 PCP: Red Christians, MD    Brief Narrative:  Donna Hill wasadmitted to the hospital with a working diagnosis of urinary retention, complicated with multiple rib fractures and dehydration.  87 year old female with severe hypoacusis who presentedwith lower abdominal pain for about 4 weeks, clinically worsening, in the setting of recent urinary tract infection. Denies any dyspnea or chest pain. On her initial physical examination blood pressure 145/92, heart rate 122, respiratory rate 20, oxygen saturation 99% on room air. She hadmild pallor, dry mucous membranes, her lungs with decreased breath sounds at bases bilaterally, heart S1-S2, present, rhythmic, soft abdomen, mildly distended, nontender, no lower extremity edema.  Sodium 128, potassium 4.0, chloride 89, bicarb 28, glucose 100, BUN 21, creatinine 0.57, white count 7.7, hemoglobin 10.7, hematocrit 33.9, platelets 289. SARS COVID-19 pending  Urinalysis specific gravity 1.008, negative proteins, negative nitrates, 0-5 red cells, 0-5 white cells.  Chest radiograph with left rotation, left pleural effusion, moderate.  CT chest with acute displaced fractures involving the left posterior ribs, 7-11. Small to moderate size loculated left pleural effusion. Mild bilateral hydronephrosis. Small right pleural effusion. Multiple vertebral body compression fractures, chronic.  Patient placed in IV fluids and as needed analgesics.  Continue to be very weak and deconditioned, not yet back to baseline.  Physical therapy has recommended SNF placement.   Assessment & Plan:   Principal Problem:   Acute urinary retention Active Problems:   Hypertension   Hypothyroidism   Osteoporosis   GERD (gastroesophageal reflux disease)   Pleural effusion   Hypoacusis   Urinary retention   Multiple rib fractures    1.Acute urinary retention.ED  bladder scan showed about 3000 mL of retained urine, after catheterization more than that 1000 mL voided with significant improvement of her symptoms.  Recurrent urinary tract infection has been ruled out, no further signs of urinary retention.  Patient has been incontinent of urine, will need a formal urologic evaluation as outpatient.    2.Left pleuralloculatedeffusion/ severe kyphosis.Rib cage pain has bee improving, patient continue to be very weak and deconditioned Plan to transfer to SNF when medically stable.   3.Hypovolemic hyponatremia/ hypokalemia/ contraction metabolic alkalosis/ volume overload acute pulmonary edema. Now patient euvolemic  Yesterday required one dose of furosemide for volume overload that tolerated well. Oxygenation today is 93% on room air, no further dyspnea.   Renal function with serum cr at 0,69, K is up to 3,9 and serum bicarbonate at 26. Mg is 1,8, Continue to avoid hypotension and nephrotoxic medications.   4.Hypertension.At home patient with benicar-HCT, bisoprolol-HCTZ- spironolactone. Improved blood pressure now with down to 128/78 mmHg.  Continue with amlodipine and lisinopril.   5.Hypothyroidism.On levothyroxine.  6.Acuteleft ribfractures, 7-11, chronic thoracic compression fractures. Continue with scheduled acetaminophen but will change ibuprofen to as needed, continue to encourage mobility. Add bowel regimen.   Continue with nutritional supplements.   Status is: Inpatient  Remains inpatient appropriate because:Inpatient level of care appropriate due to severity of illness   Dispo: The patient is from: Home              Anticipated d/c is to: SNF              Patient currently is not medically stable to d/c.   Difficult to place patient No   DVT prophylaxis: Enoxaparin   Code Status:   full  Family Communication:  No family at the bedside  Nutrition Status: Nutrition Problem: Increased nutrient  needs Etiology: acute illness Signs/Symptoms: estimated needs Interventions: Ensure Enlive (each supplement provides 350kcal and 20 grams of protein)       Subjective: Patient is feeling better, no nausea or vomiting, no dyspnea, chest pain is improving. She has bee incontinent from urine, chest pain is improving.   Objective: Vitals:   04/10/21 2213 04/11/21 0210 04/11/21 0601 04/11/21 1404  BP: 121/72 (!) 129/95 (!) 150/74 128/78  Pulse: 79 81 80 98  Resp: (!) 22 20 18 17   Temp: (!) 97.5 F (36.4 C) 97.7 F (36.5 C) 98 F (36.7 C) 97.9 F (36.6 C)  TempSrc: Oral   Oral  SpO2: 95% 95% 94% 93%  Weight:      Height:        Intake/Output Summary (Last 24 hours) at 04/11/2021 1616 Last data filed at 04/11/2021 1300 Gross per 24 hour  Intake 600 ml  Output 525 ml  Net 75 ml   Filed Weights   04/08/21 1324  Weight: 44.5 kg    Examination:   General: Not in pain or dyspnea, deconditioned  Neurology: Awake and alert, non focal  E ENT: mild pallor, no icterus, oral mucosa moist Cardiovascular: No JVD. S1-S2 present, rhythmic, no gallops, rubs, or murmurs. No lower extremity edema. Pulmonary: positive breath sounds bilaterally, decreased air movement but with no wheezing, rhonchi or rales. Gastrointestinal. Abdomen soft and non tender Skin. No rashes Musculoskeletal: no joint deformities     Data Reviewed: I have personally reviewed following labs and imaging studies  CBC: Recent Labs  Lab 04/08/21 1426  WBC 7.7  NEUTROABS 6.0  HGB 10.7*  HCT 33.9*  MCV 81.9  PLT 450   Basic Metabolic Panel: Recent Labs  Lab 04/08/21 1426 04/09/21 0506 04/10/21 0525 04/11/21 0456  NA 128* 129* 134* 134*  K 4.0 3.2* 3.4* 3.9  CL 89* 94* 100 99  CO2 28 29 26 26   GLUCOSE 100* 114* 108* 98  BUN 21 15 15 19   CREATININE 0.57 0.52 0.61 0.69  CALCIUM 9.9 8.9 8.9 8.8*  MG  --   --  1.6* 1.8   GFR: Estimated Creatinine Clearance: 29.9 mL/min (by C-G formula based on SCr  of 0.69 mg/dL). Liver Function Tests: Recent Labs  Lab 04/08/21 1426  AST 21  ALT 13  ALKPHOS 133*  BILITOT 1.1  PROT 6.6  ALBUMIN 3.8   No results for input(s): LIPASE, AMYLASE in the last 168 hours. No results for input(s): AMMONIA in the last 168 hours. Coagulation Profile: No results for input(s): INR, PROTIME in the last 168 hours. Cardiac Enzymes: No results for input(s): CKTOTAL, CKMB, CKMBINDEX, TROPONINI in the last 168 hours. BNP (last 3 results) No results for input(s): PROBNP in the last 8760 hours. HbA1C: No results for input(s): HGBA1C in the last 72 hours. CBG: No results for input(s): GLUCAP in the last 168 hours. Lipid Profile: No results for input(s): CHOL, HDL, LDLCALC, TRIG, CHOLHDL, LDLDIRECT in the last 72 hours. Thyroid Function Tests: No results for input(s): TSH, T4TOTAL, FREET4, T3FREE, THYROIDAB in the last 72 hours. Anemia Panel: No results for input(s): VITAMINB12, FOLATE, FERRITIN, TIBC, IRON, RETICCTPCT in the last 72 hours.    Radiology Studies: I have reviewed all of the imaging during this hospital visit personally     Scheduled Meds: . amLODipine  10 mg Oral Daily  . enoxaparin (LOVENOX) injection  30 mg Subcutaneous Q24H  . feeding supplement  237 mL Oral  BID BM  . levothyroxine  137 mcg Oral Q0600  . lisinopril  5 mg Oral Daily   Continuous Infusions:   LOS: 3 days        Kalista Laguardia Gerome Apley, MD

## 2021-04-11 NOTE — TOC Progression Note (Signed)
Transition of Care [facility]) - Progression Note    Patient Details  Name: Donna Hill MRN: [id number] Date of Birth: September 27, 1934  Transition [facility] Care [facility] CM/SW [place], [place] Phone Number: 04/11/2021, 11:08 AM  Clinical Narrative:   Spoke with Ms Jari Favre re: bed offers.  She and patient's husband choose [facility].  Insurance authorization request submitted. TOC will continue to follow during the course of hospitalization.     Expected Discharge Plan: Skilled Nursing Facility Barriers to Discharge: [employer]  Expected Discharge Plan and Services Expected Discharge Plan: [place]   Discharge Planning Services: CM Consult Post Acute Care Choice: [place] Living arrangements for the past 2 months: Single Family Home                                       Social Determinants of Health (SDOH) Interventions    Readmission Risk Interventions No flowsheet data found.

## 2021-04-11 NOTE — Progress Notes (Signed)
Occupational Therapy Treatment Patient Details Name: Donna Hill MRN: [id number] DOB: Jan 19, 1934 Today's Date: 04/11/2021    History of present illness Patient is an 87 year old female admitted with lower abdominal pain and urinary retention. Was treated for UTI ~1 month ago and completed course of antibiotics. PMH includes osteoarthritis, GERD, hypertension, osteoporosis and hypoacusis   OT comments  Pt progressing towards acute OT goals. Remains to need min A with functional transfers. Episode of labored breathing once back in bed. D/c plan updated to SNF for rehab at d/c.   Follow Up Recommendations  SNF    Equipment Recommendations  None recommended by OT    Recommendations for Other Services      Precautions / Restrictions Precautions Precautions: Fall Precaution Comments: VERY HOH, does not see "very well" has eye glasses but they are at home Restrictions Weight Bearing Restrictions: No       Mobility Bed Mobility Overal bed mobility: Needs Assistance Bed Mobility: Sit to Supine     Supine to sit: Min assist Sit to supine: Min guard   General bed mobility comments: min guard for safety. episode of labored breathing once in supine    Transfers Overall transfer level: Needs assistance Equipment used: Rolling walker (2 wheeled) Transfers: Sit to/from [facility] Sit to Stand: Min assist Stand pivot transfers: Min assist       General transfer comment: steadying assist during tra    Balance Overall balance assessment: Needs assistance Sitting-balance support: Feet supported Sitting balance-Leahy Scale: Good     Standing balance support: Bilateral upper extremity supported Standing balance-Leahy Scale: Poor Standing balance comment: reliant on UE support                           ADL either performed or assessed with clinical judgement   ADL Overall ADL's : Needs assistance/impaired                          Toilet Transfer: Minimal assistance;Stand-pivot;Cueing for safety;BSC Toilet Transfer Details (indicate cue type and reason): steadying assist during transitional movements. Pt requesting BSC, suspect d/t urgency. Toileting- Clothing Manipulation and Hygiene: Minimal assistance;Set up;Min guard;Sitting/lateral lean;Sit to/from stand Toileting - Clothing Manipulation Details (indicate cue type and reason): steadying assist.       General ADL Comments: pt completed toilet transfer (BSC d/t urgency), pericare, and bed mobility as detailed below.     Vision       Perception     Praxis      Cognition Arousal/Alertness: Awake/alert Behavior During Therapy: WFL for tasks assessed/performed Overall Cognitive Status: Within Functional Limits for tasks assessed                                          Exercises     Shoulder Instructions       General Comments      Pertinent Vitals/ Pain       Pain Assessment: No/denies pain  Home Living                                          Prior Functioning/Environment              Frequency  Min  2X/week        Progress Toward Goals  OT Goals(current goals can now be found in the care plan section)  Progress towards OT goals: Progressing toward goals  Acute Rehab OT Goals Patient Stated Goal: "I need to use the bathroom" OT Goal Formulation: With patient Time For Goal Achievement: 04/23/21 Potential to Achieve Goals: Good ADL Goals Pt Will Transfer to Toilet: with supervision;ambulating;regular height toilet Pt Will Perform Toileting - Clothing Manipulation and hygiene: with supervision;sit to/from stand;sitting/lateral leans Additional ADL Goal #1: Patient will be supervision level with functional standing activity for 6 minutes in order to safely participate in self care tasks.  Plan Discharge plan needs to be updated    Co-evaluation                 AM-PAC OT "6 Clicks"  Daily Activity     Outcome Measure   Help from another person eating meals?: A Little Help from another person taking care of personal grooming?: A Little Help from another person toileting, which includes using toliet, bedpan, or urinal?: A Lot Help from another person bathing (including washing, rinsing, drying)?: A Lot Help from another person to put on and taking off regular upper body clothing?: A Little Help from another person to put on and taking off regular lower body clothing?: A Lot 6 Click Score: 15    End of Session Equipment Utilized During Treatment: Rolling walker  OT Visit Diagnosis: Unsteadiness on feet (R26.81)   Activity Tolerance Patient tolerated treatment well;Other (comment) (episode of labored breathing once in supine)   Patient Left in chair;with call bell/phone within reach;with chair alarm set   Nurse Communication Mobility status        Time: 9622-2979 OT Time Calculation (min): 20 min  Charges: OT General Charges $OT Visit: 1 Visit OT Treatments $Self Care/Home Management : 8-22 mins  Tyrone Schimke, OT Acute Rehabilitation Services Pager: [phone] Office: [phone]    Donna Hill 04/11/2021, 2:07 PM

## 2021-04-11 NOTE — Progress Notes (Signed)
Physical Therapy Treatment Patient Details Name: Donna Hill MRN: [id number] DOB: 1934-05-25 Today's Date: 04/11/2021    History of Present Illness Patient is an 87 year old female admitted with lower abdominal pain and urinary retention. Was treated for UTI ~1 month ago and completed course of antibiotics. PMH includes osteoarthritis, GERD, hypertension, osteoporosis and hypoacusis    PT Comments    Pt assisted with ambulating short distance today. Pt fatigued quickly and requiring min assist.  Continue to recommend SNF.  Follow Up Recommendations  SNF     Equipment Recommendations  None recommended by PT    Recommendations for Other Services       Precautions / Restrictions Precautions Precautions: Fall Precaution Comments: VERY HOH Restrictions Weight Bearing Restrictions: No    Mobility  Bed Mobility Overal bed mobility: Needs Assistance Bed Mobility: Supine to Sit     Supine to sit: Min assist     General bed mobility comments: requiring assist for trunk upright    Transfers Overall transfer level: Needs assistance Equipment used: Rolling walker (2 wheeled) Transfers: Sit to/from Stand Sit to Stand: Min assist Stand pivot transfers: Min assist       General transfer comment: assist to rise and steady, typically uses rollator at home  Ambulation/Gait Ambulation/Gait assistance: Min assist Gait Distance (Feet): 30 Feet Assistive device: Rolling walker (2 wheeled) Gait Pattern/deviations: Step-through pattern;Decreased stride length     General Gait Details: verbal cues for RW positioning, posture however extreme thoracic kyphosis   Stairs             Wheelchair Mobility    Modified Rankin (Stroke Patients Only)       Balance                                            Cognition Arousal/Alertness: Awake/alert Behavior During Therapy: WFL for tasks assessed/performed Overall Cognitive Status: Within Functional  Limits for tasks assessed                                        Exercises      General Comments        Pertinent Vitals/Pain      Home Living                      Prior Function            PT Goals (current goals can now be found in the care plan section) Progress towards PT goals: Progressing toward goals    Frequency    Min 2X/week      PT Plan Current plan remains appropriate    Co-evaluation              AM-PAC PT "6 Clicks" Mobility   Outcome Measure  Help needed turning from your back to your side while in a flat bed without using bedrails?: A Little Help needed moving from lying on your back to sitting on the side of a flat bed without using bedrails?: A Little Help needed moving to and from a bed to a chair (including a wheelchair)?: A Little Help needed standing up from a chair using your arms (e.g., wheelchair or bedside chair)?: A Little Help needed to walk in hospital room?: A Lot Help  needed climbing 3-5 steps with a railing? : A Lot 6 Click Score: 16    End of Session Equipment Utilized During Treatment: Gait belt Activity Tolerance: Patient limited by fatigue Patient left: in chair;with call bell/phone within reach;with chair alarm set   PT Visit Diagnosis: Other abnormalities of gait and mobility (R26.89);Muscle weakness (generalized) (M62.81)     Time: 5909-3112 PT Time Calculation (min) (ACUTE ONLY): 16 min  Charges:  $Gait Training: 8-22 mins                     Jannette Spanner PT, DPT Acute Rehabilitation Services Pager: [phone] Office: [phone]  York Ram E 04/11/2021, 12:53 PM

## 2021-04-12 NOTE — Progress Notes (Signed)
PROGRESS NOTE    Donna Hill  MHD:[id number] DOB: July 01, 1934 DOA: 04/08/2021 PCP: Red Christians, MD    Brief Narrative:  Donna Hill wasadmitted to the hospital with a working diagnosis of urinary retention, complicated with multiple rib fractures and dehydration.  87 year old female with severe hypoacusis who presentedwith lower abdominal pain for about 4 weeks, clinically worsening, in the setting of recent urinary tract infection. Denies any dyspnea or chest pain. On her initial physical examination blood pressure 145/92, heart rate 122, respiratory rate 20, oxygen saturation 99% on room air. She hadmild pallor, dry mucous membranes, her lungs with decreased breath sounds at bases bilaterally, heart S1-S2, present, rhythmic, soft abdomen, mildly distended, nontender, no lower extremity edema.  Sodium 128, potassium 4.0, chloride 89, bicarb 28, glucose 100, BUN 21, creatinine 0.57, white count 7.7, hemoglobin 10.7, hematocrit 33.9, platelets 289. SARS COVID-19 pending  Urinalysis specific gravity 1.008, negative proteins, negative nitrates, 0-5 red cells, 0-5 white cells.  Chest radiograph with left rotation, left pleural effusion, moderate.  CT chest with acute displaced fractures involving the left posterior ribs, 7-11. Small to moderate size loculated left pleural effusion. Mild bilateral hydronephrosis. Small right pleural effusion. Multiple vertebral body compression fractures, chronic.  Patient placed in IV fluids and as needed analgesics.  Continue to be very weak and deconditioned, not yet back to baseline.  Physical therapy has recommended SNF placement.   Assessment & Plan:   Principal Problem:   Acute urinary retention Active Problems:   Hypertension   Hypothyroidism   Osteoporosis   GERD (gastroesophageal reflux disease)   Pleural effusion   Hypoacusis   Urinary retention   Multiple rib fractures   1.Acute urinary retention.ED  bladder scan showed about 3000 mL of retained urine, after catheterization more than that 1000 mL voided with significant improvement of her symptoms.  Recurrent urinary tract infection has been ruled out, no further signs of urinary retention.   Clinically stable no signs of recurrent urinary retention.   2.Left pleuralloculatedeffusion/ severe kyphosis.Rib cage pain has been stable. Controlled with analgesics. Continue acetaminophen and as needed ibuprofen.   3.Hypovolemic hyponatremia/ hypokalemia/ contraction metabolic alkalosis/ volume overload acute pulmonary edema. No further dyspnea, her oxygenation is 93% on room air.  Tolerating po well, electrolytes have been corrected.   Continue to encourage po intake.   4.Hypertension.Athome patient with benicar-HCT, bisoprolol-HCTZ- spironolactone. Blood pressure control with amlodipine and lisinopril. Hold on diuretics for now.   5.Hypothyroidism.Continue with levothyroxine.  6.Acuteleft ribfractures, 7-11, chronic thoracic compression fractures. On scheduled acetaminophen. Positive bowel movement yesterday.   Status is: Inpatient  Remains inpatient appropriate because:Inpatient level of care appropriate due to severity of illness   Dispo: The patient is from: Home              Anticipated d/c is to: SNF              Patient currently is medically stable to d/c.   Difficult to place patient No  DVT prophylaxis: Enoxaparin   Code Status:   full  Family Communication:  No family at the bedside      Nutrition Status: Nutrition Problem: Increased nutrient needs Etiology: acute illness Signs/Symptoms: estimated needs Interventions: Ensure Enlive (each supplement provides 350kcal and 20 grams of protein)    Subjective: Patient is feeling better, pain control with analgesics, no nausea or vomiting, no dyspnea. Continue to be very weak and deconditioned   Objective: Vitals:   04/11/21 1404  04/11/21 2017 04/12/21 0429 04/12/21  1346  BP: 128/78 122/73 (!) 152/75 130/62  Pulse: 98 80 87 96  Resp: 17 18 18 15   Temp: 97.9 F (36.6 C) 97.7 F (36.5 C) 98 F (36.7 C) 98.3 F (36.8 C)  TempSrc: Oral     SpO2: 93% 94% 92% 92%  Weight:      Height:        Intake/Output Summary (Last 24 hours) at 04/12/2021 1501 Last data filed at 04/12/2021 1300 Gross per 24 hour  Intake 360 ml  Output --  Net 360 ml   Filed Weights   04/08/21 1324  Weight: 44.5 kg    Examination:   General: Not in pain or dyspnea, deconditioned  Neurology: Awake and alert, non focal  E ENT: mild pallor, no icterus, oral mucosa moist Cardiovascular: No JVD. S1-S2 present, rhythmic, no gallops, rubs, or murmurs. No lower extremity edema. Pulmonary: positive breath sounds bilaterally, adequate air movement, no wheezing, rhonchi or rales. Gastrointestinal. Abdomen soft and non tender Skin. No rashes Musculoskeletal: no joint deformities     Data Reviewed: I have personally reviewed following labs and imaging studies  CBC: Recent Labs  Lab 04/08/21 1426  WBC 7.7  NEUTROABS 6.0  HGB 10.7*  HCT 33.9*  MCV 81.9  PLT 188   Basic Metabolic Panel: Recent Labs  Lab 04/08/21 1426 04/09/21 0506 04/10/21 0525 04/11/21 0456  NA 128* 129* 134* 134*  K 4.0 3.2* 3.4* 3.9  CL 89* 94* 100 99  CO2 28 29 26 26   GLUCOSE 100* 114* 108* 98  BUN 21 15 15 19   CREATININE 0.57 0.52 0.61 0.69  CALCIUM 9.9 8.9 8.9 8.8*  MG  --   --  1.6* 1.8   GFR: Estimated Creatinine Clearance: 29.9 mL/min (by C-G formula based on SCr of 0.69 mg/dL). Liver Function Tests: Recent Labs  Lab 04/08/21 1426  AST 21  ALT 13  ALKPHOS 133*  BILITOT 1.1  PROT 6.6  ALBUMIN 3.8   No results for input(s): LIPASE, AMYLASE in the last 168 hours. No results for input(s): AMMONIA in the last 168 hours. Coagulation Profile: No results for input(s): INR, PROTIME in the last 168 hours. Cardiac Enzymes: No results for  input(s): CKTOTAL, CKMB, CKMBINDEX, TROPONINI in the last 168 hours. BNP (last 3 results) No results for input(s): PROBNP in the last 8760 hours. HbA1C: No results for input(s): HGBA1C in the last 72 hours. CBG: No results for input(s): GLUCAP in the last 168 hours. Lipid Profile: No results for input(s): CHOL, HDL, LDLCALC, TRIG, CHOLHDL, LDLDIRECT in the last 72 hours. Thyroid Function Tests: No results for input(s): TSH, T4TOTAL, FREET4, T3FREE, THYROIDAB in the last 72 hours. Anemia Panel: No results for input(s): VITAMINB12, FOLATE, FERRITIN, TIBC, IRON, RETICCTPCT in the last 72 hours.    Radiology Studies: I have reviewed all of the imaging during this hospital visit personally     Scheduled Meds: . amLODipine  10 mg Oral Daily  . enoxaparin (LOVENOX) injection  30 mg Subcutaneous Q24H  . feeding supplement  237 mL Oral BID BM  . levothyroxine  137 mcg Oral Q0600  . lisinopril  5 mg Oral Daily  . polyethylene glycol  17 g Oral BID   Continuous Infusions:   LOS: 4 days        Joseph Bias Gerome Apley, MD

## 2021-04-12 NOTE — TOC Progression Note (Signed)
Transition of Care [facility]) - Progression Note    Patient Details  Name: Donna Hill MRN: [id number] Date of Birth: Jul 18, 1934  Transition of Care [facility]) CM/SW Contact  Lennart Pall, LCSW Phone Number: 04/12/2021, 3:12 PM  Clinical Narrative:    SNF bed accepted at [facility], however, facility cannot admit until Monday.     Expected Discharge Plan: Skilled Nursing Facility Barriers to Discharge: [employer]  Expected Discharge Plan and Services Expected Discharge Plan: [place]   Discharge Planning Services: CM Consult Post Acute Care Choice: [place] Living arrangements for the past 2 months: Single Family Home                                       Social Determinants of Health (SDOH) Interventions    Readmission Risk Interventions No flowsheet data found.

## 2021-04-12 NOTE — Plan of Care (Signed)
?  Problem: Clinical Measurements: ?Goal: Will remain free from infection ?Outcome: Progressing ?  ?

## 2021-04-13 LAB — URINALYSIS, ROUTINE W REFLEX MICROSCOPIC
Bilirubin Urine: NEGATIVE
Glucose, UA: NEGATIVE mg/dL
Ketones, ur: NEGATIVE mg/dL
Nitrite: NEGATIVE
Protein, ur: 100 mg/dL — AB
RBC / HPF: >50 RBC/hpf — ABNORMAL HIGH (ref 0–5)
Specific Gravity, Urine: 1.013 (ref 1.005–1.030)
WBC, UA: >50 WBC/hpf — ABNORMAL HIGH (ref 0–5)
pH: 6 (ref 5.0–8.0)

## 2021-04-13 NOTE — Progress Notes (Signed)
PROGRESS NOTE    Donna Hill  YTK:[id number] DOB: 01-08-1934 DOA: 04/08/2021 PCP: Red Christians, MD    Brief Narrative:  Donna Hill wasadmitted to the hospital with a working diagnosis of urinary retention, complicated with multiple rib fractures and dehydration.  87 year old female with severe hypoacusis who presentedwith lower abdominal pain for about 4 weeks, clinically worsening, in the setting of recent urinary tract infection. Denies any dyspnea or chest pain. On her initial physical examination blood pressure 145/92, heart rate 122, respiratory rate 20, oxygen saturation 99% on room air. She hadmild pallor, dry mucous membranes, her lungs with decreased breath sounds at bases bilaterally, heart S1-S2, present, rhythmic, soft abdomen, mildly distended, nontender, no lower extremity edema.  Sodium 128, potassium 4.0, chloride 89, bicarb 28, glucose 100, BUN 21, creatinine 0.57, white count 7.7, hemoglobin 10.7, hematocrit 33.9, platelets 289. SARS COVID-19 pending  Urinalysis specific gravity 1.008, negative proteins, negative nitrates, 0-5 red cells, 0-5 white cells.  Chest radiograph with left rotation, left pleural effusion, moderate.  CT chest with acute displaced fractures involving the left posterior ribs, 7-11. Small to moderate size loculated left pleural effusion. Mild bilateral hydronephrosis. Small right pleural effusion. Multiple vertebral body compression fractures, chronic.  Patient placed in IV fluids and as needed analgesics.  Continue to be very weak and deconditioned, not yet back to baseline.  Physical therapy has recommended SNF placement.   Assessment & Plan:   Principal Problem:   Acute urinary retention Active Problems:   Hypertension   Hypothyroidism   Osteoporosis   GERD (gastroesophageal reflux disease)   Pleural effusion   Hypoacusis   Urinary retention   Multiple rib fractures    1.Acute urinary retention.ED  bladder scan showed about 3000 mL of retained urine, after catheterization more than that 1000 mL voided with significant improvement of her symptoms.  Recurrent urinary tract infection has been ruled out, no further signs of urinary retention.  No urinary retention but positive dysuria today. Plan to check urine analysis, hold on antibiotic therapy for now until results available.    2.Left pleuralloculatedeffusion/ severe kyphosis.Oxygenating well, no dyspnea, chest pain is well controlled with analgesics.  3.Hypovolemic hyponatremia/ hypokalemia/ contraction metabolic alkalosis/ volume overload acute pulmonary edema. Oxymetry has been stable. Patient tolerating po well.   4.Hypertension.Athome patient with benicar-HCT, bisoprolol-HCTZ- spironolactone. Continue blood pressure control with amlodipine and lisinopril.  5.Hypothyroidism.On levothyroxine.  6.Acuteleft ribfractures, 7-11, chronic thoracic compression fractures. Continue with scheduled acetaminophen.  Status is: Inpatient  Remains inpatient appropriate because:Inpatient level of care appropriate due to severity of illness   Dispo: The patient is from: Home              Anticipated d/c is to: SNF              Patient currently is not medically stable to d/c.   Difficult to place patient No  DVT prophylaxis: Enoxaparin   Code Status:   full  Family Communication:  No family at the beside      Nutrition Status: Nutrition Problem: Increased nutrient needs Etiology: acute illness Signs/Symptoms: estimated needs Interventions: Ensure Enlive (each supplement provides 350kcal and 20 grams of protein)     Subjective: Patient complains of dysuria, no nausea or vomiting, chest pain is controlled with analgesics. No nausea or vomiting.   Objective: Vitals:   04/11/21 2017 04/12/21 0429 04/12/21 1346 04/12/21 2139  BP: 122/73 (!) 152/75 130/62 (!) 161/72  Pulse: 80 87 96 90  Resp: 18  18  15 20  Temp: 97.7 F (36.5 C) 98 F (36.7 C) 98.3 F (36.8 C) 97.8 F (36.6 C)  TempSrc:    Oral  SpO2: 94% 92% 92% 92%  Weight:      Height:        Intake/Output Summary (Last 24 hours) at 04/13/2021 1350 Last data filed at 04/13/2021 1313 Gross per 24 hour  Intake 580 ml  Output 1325 ml  Net -745 ml   Filed Weights   04/08/21 1324  Weight: 44.5 kg    Examination:   General: Not in pain or dyspnea, deconditioned  Neurology: Awake and alert, non focal  E ENT: mild pallor, no icterus, oral mucosa moist Cardiovascular: No JVD. S1-S2 present, rhythmic, no gallops, rubs, or murmurs. No lower extremity edema. Pulmonary: positive breath sounds bilaterally, adequate air movement, no wheezing, rhonchi or rales. Gastrointestinal. Abdomen soft and non tender Skin. No rashes Musculoskeletal: no joint deformities     Data Reviewed: I have personally reviewed following labs and imaging studies  CBC: Recent Labs  Lab 04/08/21 1426  WBC 7.7  NEUTROABS 6.0  HGB 10.7*  HCT 33.9*  MCV 81.9  PLT 233   Basic Metabolic Panel: Recent Labs  Lab 04/08/21 1426 04/09/21 0506 04/10/21 0525 04/11/21 0456  NA 128* 129* 134* 134*  K 4.0 3.2* 3.4* 3.9  CL 89* 94* 100 99  CO2 28 29 26 26   GLUCOSE 100* 114* 108* 98  BUN 21 15 15 19   CREATININE 0.57 0.52 0.61 0.69  CALCIUM 9.9 8.9 8.9 8.8*  MG  --   --  1.6* 1.8   GFR: Estimated Creatinine Clearance: 29.9 mL/min (by C-G formula based on SCr of 0.69 mg/dL). Liver Function Tests: Recent Labs  Lab 04/08/21 1426  AST 21  ALT 13  ALKPHOS 133*  BILITOT 1.1  PROT 6.6  ALBUMIN 3.8   No results for input(s): LIPASE, AMYLASE in the last 168 hours. No results for input(s): AMMONIA in the last 168 hours. Coagulation Profile: No results for input(s): INR, PROTIME in the last 168 hours. Cardiac Enzymes: No results for input(s): CKTOTAL, CKMB, CKMBINDEX, TROPONINI in the last 168 hours. BNP (last 3 results) No results for  input(s): PROBNP in the last 8760 hours. HbA1C: No results for input(s): HGBA1C in the last 72 hours. CBG: No results for input(s): GLUCAP in the last 168 hours. Lipid Profile: No results for input(s): CHOL, HDL, LDLCALC, TRIG, CHOLHDL, LDLDIRECT in the last 72 hours. Thyroid Function Tests: No results for input(s): TSH, T4TOTAL, FREET4, T3FREE, THYROIDAB in the last 72 hours. Anemia Panel: No results for input(s): VITAMINB12, FOLATE, FERRITIN, TIBC, IRON, RETICCTPCT in the last 72 hours.    Radiology Studies: I have reviewed all of the imaging during this hospital visit personally     Scheduled Meds: . amLODipine  10 mg Oral Daily  . enoxaparin (LOVENOX) injection  30 mg Subcutaneous Q24H  . feeding supplement  237 mL Oral BID BM  . levothyroxine  137 mcg Oral Q0600  . lisinopril  5 mg Oral Daily  . polyethylene glycol  17 g Oral BID   Continuous Infusions:   LOS: 5 days        Giovan Pinsky Gerome Apley, MD

## 2021-04-14 DIAGNOSIS — N390 Urinary tract infection, site not specified: Secondary | ICD-10-CM

## 2021-04-14 LAB — RESP PANEL BY RT-PCR (FLU A&B, COVID) ARPGX2
Influenza A by PCR: NEGATIVE
Influenza B by PCR: NEGATIVE
SARS Coronavirus 2 by RT PCR: NEGATIVE

## 2021-04-14 MED ORDER — CEPHALEXIN 500 MG PO CAPS
500.0000 mg | ORAL_CAPSULE | Freq: Two times a day (BID) | ORAL | Status: DC
  Filled 2021-04-14: qty 1

## 2021-04-14 MED ORDER — CEPHALEXIN 250 MG PO CAPS: 250.0000 mg | ORAL_CAPSULE | Freq: Three times a day (TID) | ORAL | 0 refills | Status: AC

## 2021-04-14 MED ORDER — LISINOPRIL 5 MG PO TABS: 5.0000 mg | ORAL_TABLET | Freq: Every day | ORAL | 0 refills | Status: AC

## 2021-04-14 MED ORDER — CEPHALEXIN 250 MG PO CAPS: 250.0000 mg | ORAL_CAPSULE | Freq: Three times a day (TID) | ORAL | Status: DC

## 2021-04-14 MED ORDER — POLYETHYLENE GLYCOL 3350 17 G PO PACK: 17.0000 g | PACK | Freq: Every day | ORAL | 0 refills | Status: AC | PRN

## 2021-04-14 MED ORDER — ENSURE ENLIVE PO LIQD: 237.0000 mL | Freq: Two times a day (BID) | ORAL | 0 refills | Status: AC

## 2021-04-14 MED ORDER — AMLODIPINE BESYLATE 10 MG PO TABS: 10.0000 mg | ORAL_TABLET | Freq: Every day | ORAL | 0 refills | Status: AC

## 2021-04-14 MED ORDER — CEPHALEXIN 250 MG PO CAPS
250.0000 mg | ORAL_CAPSULE | Freq: Three times a day (TID) | ORAL | Status: DC
  Administered 2021-04-14: 250 mg via ORAL
  Filled 2021-04-14: qty 1

## 2021-04-14 MED ORDER — ACETAMINOPHEN 325 MG PO TABS: 650.0000 mg | ORAL_TABLET | Freq: Four times a day (QID) | ORAL | Status: AC | PRN

## 2021-04-14 NOTE — Care Management Important Message (Signed)
Important Message  Patient Details IM Letter given to the Patient. Name: Donna Hill MRN: [id number] Date of Birth: 1934-07-18   Medicare Important Message Given:  Yes     Kerin Salen 04/14/2021, 12:17 PM

## 2021-04-14 NOTE — Progress Notes (Signed)
Husband notified about wife transferring patient to [facility] (room 509). Patient transferred with belongings at bedside.

## 2021-04-14 NOTE — Discharge Summary (Signed)
Physician Discharge Summary  Donna Hill GUY:[id number] DOB: 03-03-1934 DOA: 04/08/2021  PCP: Red Christians, MD  Admit date: 04/08/2021 Discharge date: 04/14/2021  Admitted From: Home  Disposition:  SNF  Recommendations for Outpatient Follow-up and new medication changes:  1. Follow up with Dr. Ella Jubilee in 7 to 10 days.  2. Continue Cephalexin for 5 days.  Home Health: na   Equipment/Devices: na    Discharge Condition: stable  CODE STATUS: full  Diet recommendation:  Hearth heathy   Brief/Interim Summary: Donna Hill wasadmitted to the hospital with a working diagnosis of urinary retention, complicated with multiple rib fractures and dehydration.  87 year old female with who presentedwith lower abdominal pain for about 4 weeks, clinically worsening, in the setting of recent urinary tract infection and frequent falls. Denied any dyspnea or chest pain. On her initial physical examination blood pressure 145/92, heart rate 122, respiratory rate 20, oxygen saturation 99% on room air. She hadmild pallor, dry mucous membranes, her lungs with decreased breath sounds at bases bilaterally, heart S1-S2, present, rhythmic, soft abdomen, mildly distended, nontender, no lower extremity edema.  Sodium 128, potassium 4.0, chloride 89, bicarb 28, glucose 100, BUN 21, creatinine 0.57, white count 7.7, hemoglobin 10.7, hematocrit 33.9, platelets 289. SARS COVID-19 pending  Urinalysis specific gravity 1.008, negative proteins, negative nitrates, 0-5 red cells, 0-5 white cells.  Chest radiograph with left rotation, left pleural effusion, moderate.  CT chest with acute displaced fractures involving the left posterior ribs, 7-11. Small to moderate size loculated left pleural effusion. Mild bilateral hydronephrosis. Small right pleural effusion. Multiple vertebral body compression fractures, chronic.  Bladder scan with significant urinary retention.   Patient placed in IV fluids and as  needed analgesics.  Continue to be very weak and deconditioned, not yet back to baseline.  Physical therapy has recommended SNF placement.  Patient with dysuria, repeat urine analysis positive for pyuria, patient has been placed on oral cephalexin with good toleration.   1.  Acute urinary retention, urinary tract infection.  Her bladder scan in the ED showed 3000 mL of protein in urine, after catheterization about 1000 mL of urine was obtained with significant improvement of her symptoms.  Her initial urinalysis was negative for infection.  During her hospitalization she complained of dysuria, follow-up urinalysis was positive for pyuria, consistent with urine infection (apparenty not present on admission)   She has been placed on cephalexin, 500 minutes twice daily to take for 5 days.  2.  Left pleural loculated effusion, severe kyphosis.  Chronic effusion, no respiratory compromise, continue outpatient follow-up.  No signs of local infection.  3.  Hypovolemic hyponatremia, hypokalemia, contraction alkalosis,.  Patient received intravenous fluids, her sodium and potassium were corrected. Unfortunately she did develop volume overload acute pulmonary edema, requiring diuresis with furosemide.  At her discharge sodium 134, potassium 3.9, chloride 99, bicarb 26, glucose 98, BUN 19, creatinine 0.69, magnesium 1.8. Follow-up kidney function as an outpatient.  4.  Hypertension.  At home patient taking multiple medicines including hydrochlorothiazide Benicar, bisoprolol, spironolactone.  Her antihypertensive regimen has been simplified to amlodipine and lisinopril.  Her discharge blood pressure is 147/71 mmHg.   5.  Hypothyroidism.  Continue levothyroxine.  6.  Acute left rib fractures, 7-11th.  Chronic thoracic compression fractures. Patient received analgesics with acetaminophen with good toleration.  She was evaluated by physical therapy and occupational therapy, recommendations to  continue rehab at the skilled nursing facility.  7. Anemia of chronic disease. Hgb 10,7 with Hct at 33,9, continue  close follow up as outpatient.    Discharge Diagnoses:  Principal Problem:   Acute urinary retention Active Problems:   Hypertension   Hypothyroidism   Osteoporosis   GERD (gastroesophageal reflux disease)   Pleural effusion   Hypoacusis   Urinary retention   Multiple rib fractures   Acute lower UTI    Discharge Instructions   Allergies as of 04/14/2021      Reactions   Codeine Nausea Only   Histamine    Other reaction(s): Other (See Comments)   Meperidine Nausea Only   Poison Ivy Extract       Medication List    STOP taking these medications   Benicar HCT 40-25 MG tablet Generic drug: olmesartan-hydrochlorothiazide   bisoprolol-hydrochlorothiazide 2.5-6.25 MG tablet Commonly known as: ZIAC   potassium chloride 10 MEQ CR capsule Commonly known as: MICRO-K   spironolactone 25 MG tablet Commonly known as: ALDACTONE     TAKE these medications   acetaminophen 325 MG tablet Commonly known as: TYLENOL Take 2 tablets (650 mg total) by mouth every 6 (six) hours as needed for mild pain or moderate pain (or Fever >/= 101).   amLODipine 10 MG tablet Commonly known as: NORVASC Take 1 tablet (10 mg total) by mouth daily.   cephALEXin 250 MG capsule Commonly known as: KEFLEX Take 1 capsule (250 mg total) by mouth every 8 (eight) hours for 5 days.   feeding supplement Liqd Take 237 mLs by mouth 2 (two) times daily between meals.   fluticasone 27.5 MCG/SPRAY nasal spray Commonly known as: VERAMYST Place 1 spray into the nose daily as needed for rhinitis or allergies.   lisinopril 5 MG tablet Commonly known as: ZESTRIL Take 1 tablet (5 mg total) by mouth daily.   Patanol 0.1 % ophthalmic solution Generic drug: olopatadine Place 1 drop into both eyes at bedtime.   polyethylene glycol 17 g packet Commonly known as: MIRALAX / GLYCOLAX Take 17 g by  mouth daily as needed for mild constipation or moderate constipation.   RABEprazole 20 MG tablet Commonly known as: ACIPHEX Take 20 mg by mouth daily.   Synthroid 137 MCG tablet Generic drug: levothyroxine Take 137 mcg by mouth daily.       Contact information for after-discharge care    Destination    [facility] .   Service: Skilled Nursing Contact information: [place] [address] [phone]                 Allergies  Allergen Reactions  . Codeine Nausea Only  . Histamine     Other reaction(s): Other (See Comments)  . Meperidine Nausea Only  . Poison Ivy Extract        Procedures/Studies: DG Chest 1 View  Result Date: 04/10/2021 CLINICAL DATA:  Dyspnea EXAM: CHEST  1 VIEW COMPARISON:  04/08/2021 FINDINGS: Cardiac shadow is enlarged but stable. Aortic calcifications are noted. Bilateral pleural effusions are noted left greater than right stable from the prior exam. Mild vascular congestion and edema is seen. Changes of prior vertebral augmentation are noted at multiple thoracic levels. Old healing rib fractures are noted bilaterally. IMPRESSION: Bilateral effusions left greater than right stable from the prior exam. Vascular congestion and mild edema consistent with CHF. Old rib trauma with healing and prior vertebral augmentation. Electronically Signed   By: Inez Catalina M.D.   On: 04/10/2021 15:09   DG Ribs Unilateral W/Chest Left  Result Date: 04/08/2021 CLINICAL DATA:  Fall 1 month  ago, left-sided rib fracture EXAM: LEFT RIBS AND CHEST - 3+ VIEW COMPARISON:  03/26/2021 FINDINGS: The heart is normal in size. Severe atherosclerosis of the aortic arch. Moderate left pleural effusion and associated atelectasis or consolidation, slightly increased compared to prior examination. Minimally displaced lower left rib fractures, partially callused. Multilevel kyphoplasty of the thoracic and lumbar spine. IMPRESSION: 1. Moderate  left pleural effusion and associated atelectasis or consolidation, slightly increased compared to prior examination. 2. Minimally displaced lower left rib fractures, partially callused. Electronically Signed   By: Eddie Candle M.D.   On: 04/08/2021 14:58   CT Chest Wo Contrast  Result Date: 04/08/2021 CLINICAL DATA:  Left back and flank pain. EXAM: CT CHEST WITHOUT CONTRAST TECHNIQUE: Multidetector CT imaging of the chest was performed following the standard protocol without IV contrast. COMPARISON:  Chest radiograph 04/08/2021 and thoracic spine examination 03/26/2021 and thoracic spine MRI 12/27/2020 and abdominal CT 09/17/2020 FINDINGS: Cardiovascular: Thoracic aorta is heavily calcified. Normal caliber of the thoracic aorta. Coronary artery calcifications. Mediastinum/Nodes: No significant chest lymphadenopathy but limited evaluation of the left hilum due to volume loss in left hemithorax. Lack of intravascular contrast also limits evaluation of the mediastinal structures. Lungs/Pleura: Trachea and mainstem bronchi are patent. Small to moderate amount of loculated left pleural fluid. Volume loss throughout the left lower lobe. Mild scarring at the right lung apex. Trace right pleural fluid. Upper Abdomen: Enlargement of the renal pelvis bilaterally. Evidence for at least mild hydronephrosis. The hydronephrosis is new since 09/17/2020. Limited evaluation of the abdomen without intravascular contrast. Musculoskeletal: Numerous old right rib fractures. Numerous old left rib fractures. Acute displaced fractures involving the posterior left eighth rib. Severely displaced fracture involving the posterior left ninth rib just posterior to the descending thoracic aorta. Segmental fractures involving the posterior left tenth rib posterior to the thoracic aorta. Displaced fracture involving the left eleventh rib. Displaced fracture involving the posterior left seventh rib. Severe kyphosis of the thoracic spine. Normal  alignment at the cervicothoracic junction. There is a bone cement within the T8 vertebral body, T11 and T12 vertebral bodies. There are old compression fractures at T6, T7, T8, T10, T11 and T12. There also appears to be an old compression fracture involving superior endplate at L1. IMPRESSION: 1. Acute displaced fractures involving left posterior ribs (7th-11th ribs). 2. Small to moderate sized loculated left pleural effusion. Limited evaluation of the pleural fluid without intravascular contrast. 3. At least mild hydronephrosis bilaterally. Recommend further characterization with renal ultrasound or CT of the abdomen and pelvis. 4. Small amount of right pleural fluid. 5. Volume loss in the left lower lobe. 6. Multiple vertebral body compression fractures. These fractures have not significant changed since MRI on 12/27/2020. Electronically Signed   By: Markus Daft M.D.   On: 04/08/2021 16:59   US Renal  Result Date: 04/08/2021 CLINICAL DATA:  87 year old female with hydronephrosis. EXAM: RENAL / URINARY TRACT ULTRASOUND COMPLETE COMPARISON:  None. FINDINGS: Evaluation is limited due to inability of the patient to cooperate with exam and overlying bowel gas. Right Kidney: Renal measurements: 10.8 x 4.2 x 5.4 cm = volume: 126 mL. Normal echogenicity. Mild hydronephrosis. No shadowing stone. Left Kidney: Renal measurements: 10.0 x 5.2 x 5.3 cm = volume: 144 mL. Normal echogenicity. Mild hydronephrosis. No shadowing stone. Bladder: Appears normal for degree of bladder distention. The ureteral jets are not identified. The prevoid bladder volume is 865 cc. Other: None. IMPRESSION: Mild bilateral hydronephrosis.  No shadowing stone. Electronically Signed   By: Milas Hock  Radparvar M.D.   On: 04/08/2021 17:36       Subjective: Patient is feeling better, her dysuria is improving, no urinary retention, no nausea or vomiting, no dyspnea. Left sided chest pain is controlled with oral analgesics   Discharge  Exam: Vitals:   04/13/21 2032 04/14/21 0509  BP: (!) 150/89 (!) 147/71  Pulse: 96 90  Resp: 20 (!) 22  Temp: 98.4 F (36.9 C) 97.7 F (36.5 C)  SpO2: 92% 92%   Vitals:   04/12/21 2139 04/13/21 1419 04/13/21 2032 04/14/21 0509  BP: (!) 161/72 (!) 150/76 (!) 150/89 (!) 147/71  Pulse: 90 100 96 90  Resp: 20 (!) 24 20 (!) 22  Temp: 97.8 F (36.6 C) 99 F (37.2 C) 98.4 F (36.9 C) 97.7 F (36.5 C)  TempSrc: Oral Oral Oral Oral  SpO2: 92% 95% 92% 92%  Weight:      Height:        General: Not in pain or dyspnea Neurology: Awake and alert, non focal  E ENT: no pallor, no icterus, oral mucosa moist Cardiovascular: No JVD. S1-S2 present, rhythmic, no gallops, rubs, or murmurs. No lower extremity edema. Pulmonary: positive breath sounds bilaterally, adequate air movement, no wheezing, rhonchi or rales. Gastrointestinal. Abdomen soft and non tender, Skin. No rashes Musculoskeletal: no joint deformities   The results of significant diagnostics from this hospitalization (including imaging, microbiology, ancillary and laboratory) are listed below for reference.     Microbiology: Recent Results (from the past 240 hour(s))  Urine culture     Status: None   Collection Time: 04/08/21  1:50 PM   Specimen: Urine, Random  Result Value Ref Range Status   Specimen Description   Final    URINE, RANDOM Performed at [facility] [address] [place], [place] [address]    Special Requests   Final    NONE Performed at [facility], [place] [address] [place], [place] [address]    Culture   Final    NO GROWTH Performed at [facility], [place] [address], [place], [place] [address]    Report Status 04/10/2021 FINAL  Final  SARS CORONAVIRUS 2 (TAT 6-24 HRS) Nasopharyngeal Nasopharyngeal Swab     Status: None   Collection Time: 04/09/21  2:00 AM   Specimen: Nasopharyngeal Swab  Result Value Ref Range Status   SARS Coronavirus 2 NEGATIVE NEGATIVE  Final    Comment: (NOTE) SARS-CoV-2 target nucleic acids are NOT DETECTED.  The SARS-CoV-2 RNA is generally detectable in upper and lower respiratory specimens during the acute phase of infection. Negative results do not preclude SARS-CoV-2 infection, do not rule out co-infections with other pathogens, and should not be used as the sole basis for treatment or other patient management decisions. Negative results must be combined with clinical observations, patient history, and epidemiological information. The expected result is Negative.  Fact Sheet for Patients: SugarRoll.be  Fact Sheet for Healthcare Providers: https://www.woods-mathews.com/  This test is not yet approved or cleared by the [place] FDA and  has been authorized for detection and/or diagnosis of SARS-CoV-2 by FDA under an Emergency Use Authorization (EUA). This EUA will remain  in effect (meaning this test can be used) for the duration of the COVID-19 declaration under Se ction 564(b)(1) of the Act, 21 U.S.C. section 360bbb-3(b)(1), unless the authorization is terminated or revoked sooner.  Performed at [facility], [place] [address], [place],  [address]      Labs:  BNP (last 3 results) No results for input(s): BNP in the last 8760 hours. Basic Metabolic Panel: Recent Labs  Lab 04/08/21 1426 04/09/21 0506 04/10/21 0525 04/11/21 0456  NA 128* 129* 134* 134*  K 4.0 3.2* 3.4* 3.9  CL 89* 94* 100 99  CO2 28 29 26 26   GLUCOSE 100* 114* 108* 98  BUN 21 15 15 19   CREATININE 0.57 0.52 0.61 0.69  CALCIUM 9.9 8.9 8.9 8.8*  MG  --   --  1.6* 1.8   Liver Function Tests: Recent Labs  Lab 04/08/21 1426  AST 21  ALT 13  ALKPHOS 133*  BILITOT 1.1  PROT 6.6  ALBUMIN 3.8   No results for input(s): LIPASE, AMYLASE in the last 168 hours. No results for input(s): AMMONIA in the last 168 hours. CBC: Recent Labs  Lab 04/08/21 1426  WBC 7.7  NEUTROABS  6.0  HGB 10.7*  HCT 33.9*  MCV 81.9  PLT 289   Cardiac Enzymes: No results for input(s): CKTOTAL, CKMB, CKMBINDEX, TROPONINI in the last 168 hours. BNP: Invalid input(s): POCBNP CBG: No results for input(s): GLUCAP in the last 168 hours. D-Dimer No results for input(s): DDIMER in the last 72 hours. Hgb A1c No results for input(s): HGBA1C in the last 72 hours. Lipid Profile No results for input(s): CHOL, HDL, LDLCALC, TRIG, CHOLHDL, LDLDIRECT in the last 72 hours. Thyroid function studies No results for input(s): TSH, T4TOTAL, T3FREE, THYROIDAB in the last 72 hours.  Invalid input(s): FREET3 Anemia work up No results for input(s): VITAMINB12, FOLATE, FERRITIN, TIBC, IRON, RETICCTPCT in the last 72 hours. Urinalysis    Component Value Date/Time   COLORURINE RED (A) 04/13/2021 1326   APPEARANCEUR CLOUDY (A) 04/13/2021 1326   LABSPEC 1.013 04/13/2021 1326   PHURINE 6.0 04/13/2021 1326   GLUCOSEU NEGATIVE 04/13/2021 1326   HGBUR LARGE (A) 04/13/2021 1326   BILIRUBINUR NEGATIVE 04/13/2021 1326   [place] 04/13/2021 1326   PROTEINUR 100 (A) 04/13/2021 1326   NITRITE NEGATIVE 04/13/2021 1326   LEUKOCYTESUR LARGE (A) 04/13/2021 1326   Sepsis Labs Invalid input(s): PROCALCITONIN,  WBC,  LACTICIDVEN Microbiology Recent Results (from the past 240 hour(s))  Urine culture     Status: None   Collection Time: 04/08/21  1:50 PM   Specimen: Urine, Random  Result Value Ref Range Status   Specimen Description   Final    URINE, RANDOM Performed at [facility], [place] [address], [place], [place] [address]    Special Requests   Final    NONE Performed at [facility], [place] [address] [place], [place] [address]    Culture   Final    NO GROWTH Performed at [facility], [place] [address] [place], [place] [address]    Report Status 04/10/2021 FINAL  Final  SARS CORONAVIRUS 2 (TAT 6-24 HRS) Nasopharyngeal Nasopharyngeal Swab      Status: None   Collection Time: 04/09/21  2:00 AM   Specimen: Nasopharyngeal Swab  Result Value Ref Range Status   SARS Coronavirus 2 NEGATIVE NEGATIVE Final    Comment: (NOTE) SARS-CoV-2 target nucleic acids are NOT DETECTED.  The SARS-CoV-2 RNA is generally detectable in upper and lower respiratory specimens during the acute phase of infection. Negative results do not preclude SARS-CoV-2 infection, do not rule out co-infections with other pathogens, and should not be used as the sole basis for treatment or other patient management decisions. Negative results must be combined with clinical observations, patient history,  and epidemiological information. The expected result is Negative.  Fact Sheet for Patients: SugarRoll.be  Fact Sheet for Healthcare Providers: https://www.woods-mathews.com/  This test is not yet approved or cleared by the [place] FDA and  has been authorized for detection and/or diagnosis of SARS-CoV-2 by FDA under an Emergency Use Authorization (EUA). This EUA will remain  in effect (meaning this test can be used) for the duration of the COVID-19 declaration under Se ction 564(b)(1) of the Act, 21 U.S.C. section 360bbb-3(b)(1), unless the authorization is terminated or revoked sooner.  Performed at [facility], [place] [address], [place], [place] [address]      Time coordinating discharge: 45 minutes  SIGNED:   Tawni Millers, MD  [facility] 04/14/2021, 9:51 AM

## 2021-04-14 NOTE — Progress Notes (Signed)
PTAR picked up patient from room 1518. Patient transferring to 25 at [facility]. Patient did not want Covid-19 vaccines prior to discharge.

## 2021-04-14 NOTE — TOC Progression Note (Addendum)
Transition of Care [facility]) - Progression Note    Patient Details  Name: Donna Hill MRN: [id number] Date of Birth: 1934/02/22  Transition of Care [facility]) CM/SW [place], [place] Phone Number: 04/14/2021, 8:27 AM  Clinical Narrative:  Tamera Stands insurance auth through Tuesday, the 7th. [id number] is auth # and reference is [id number].  Cyril Mourning is reviewer, FAX [phone].    Addendum:  Patient will transfer to [facility] today.  PTAR arranged.  Family alerted.  Nursing, please call report to [phone], room 509P. TOC sign off.    Expected Discharge Plan: Skilled Nursing Facility Barriers to Discharge: Ship [employer]  Expected Discharge Plan and Services Expected Discharge Plan: [place]   Discharge Planning Services: CM Consult Post Acute Care Choice: [place] Living arrangements for the past 2 months: Single Family Home                                       Social Determinants of Health (SDOH) Interventions    Readmission Risk Interventions No flowsheet data found.

## 2021-05-09 ENCOUNTER — Emergency Department (HOSPITAL_COMMUNITY)
Admission: EM | Admit: 2021-05-09 | Discharge: 2021-05-09 | Disposition: A | Discharge status: Home / Self Care | Payer: Medicare Other | Attending: Emergency Medicine | Admitting: Emergency Medicine

## 2021-05-09 ENCOUNTER — Emergency Department (HOSPITAL_COMMUNITY): Payer: Medicare Other

## 2021-05-09 DIAGNOSIS — Z85850 Personal history of malignant neoplasm of thyroid: Secondary | ICD-10-CM | POA: Diagnosis not present

## 2021-05-09 DIAGNOSIS — R059 Cough, unspecified: Secondary | ICD-10-CM | POA: Diagnosis not present

## 2021-05-09 DIAGNOSIS — M25512 Pain in left shoulder: Secondary | ICD-10-CM

## 2021-05-09 DIAGNOSIS — R0602 Shortness of breath: Secondary | ICD-10-CM | POA: Insufficient documentation

## 2021-05-09 DIAGNOSIS — Z79899 Other long term (current) drug therapy: Secondary | ICD-10-CM | POA: Insufficient documentation

## 2021-05-09 DIAGNOSIS — E039 Hypothyroidism, unspecified: Secondary | ICD-10-CM | POA: Diagnosis not present

## 2021-05-09 DIAGNOSIS — I10 Essential (primary) hypertension: Secondary | ICD-10-CM | POA: Insufficient documentation

## 2021-05-09 LAB — CBC WITH DIFFERENTIAL/PLATELET
Abs Immature Granulocytes: 0.05 10*3/uL (ref 0.00–0.07)
Basophils Absolute: 0 10*3/uL (ref 0.0–0.1)
Basophils Relative: 0 %
Eosinophils Absolute: 0 10*3/uL (ref 0.0–0.5)
Eosinophils Relative: 0 %
HCT: 35.7 % — ABNORMAL LOW (ref 36.0–46.0)
Hemoglobin: 11 g/dL — ABNORMAL LOW (ref 12.0–15.0)
Immature Granulocytes: 1 %
Lymphocytes Relative: 7 %
Lymphs Abs: 0.7 10*3/uL (ref 0.7–4.0)
MCH: 25.5 pg — ABNORMAL LOW (ref 26.0–34.0)
MCHC: 30.8 g/dL (ref 30.0–36.0)
MCV: 82.6 fL (ref 80.0–100.0)
Monocytes Absolute: 1.1 10*3/uL — ABNORMAL HIGH (ref 0.1–1.0)
Monocytes Relative: 10 %
Neutro Abs: 8.7 10*3/uL — ABNORMAL HIGH (ref 1.7–7.7)
Neutrophils Relative %: 82 %
Platelets: 340 10*3/uL (ref 150–400)
RBC: 4.32 MIL/uL (ref 3.87–5.11)
RDW: 15.9 % — ABNORMAL HIGH (ref 11.5–15.5)
WBC: 10.6 10*3/uL — ABNORMAL HIGH (ref 4.0–10.5)
nRBC: 0 % (ref 0.0–0.2)

## 2021-05-09 LAB — TROPONIN I (HIGH SENSITIVITY)
Troponin I (High Sensitivity): 10 ng/L (ref <18)
Troponin I (High Sensitivity): 10 ng/L (ref <18)

## 2021-05-09 LAB — BASIC METABOLIC PANEL
Anion gap: 11 (ref 5–15)
BUN: 24 mg/dL — ABNORMAL HIGH (ref 8–23)
CO2: 27 mmol/L (ref 22–32)
Calcium: 9.6 mg/dL (ref 8.9–10.3)
Chloride: 92 mmol/L — ABNORMAL LOW (ref 98–111)
Creatinine, Ser: 0.75 mg/dL (ref 0.44–1.00)
GFR, Estimated: >60 mL/min (ref >60)
Glucose, Bld: 93 mg/dL (ref 70–99)
Potassium: 3 mmol/L — ABNORMAL LOW (ref 3.5–5.1)
Sodium: 130 mmol/L — ABNORMAL LOW (ref 135–145)

## 2021-05-09 LAB — BRAIN NATRIURETIC PEPTIDE: B Natriuretic Peptide: 346.7 pg/mL — ABNORMAL HIGH (ref 0.0–100.0)

## 2021-05-09 LAB — MAGNESIUM: Magnesium: 1.7 mg/dL (ref 1.7–2.4)

## 2021-05-09 MED ORDER — ACETAMINOPHEN 500 MG PO TABS
500.0000 mg | ORAL_TABLET | Freq: Once | ORAL | Status: AC
  Administered 2021-05-09: 500 mg via ORAL
  Filled 2021-05-09: qty 1

## 2021-05-09 MED ORDER — DICLOFENAC SODIUM 1 % EX GEL: 2.0000 g | Freq: Four times a day (QID) | CUTANEOUS | 0 refills | Status: AC

## 2021-05-09 MED ORDER — FENTANYL CITRATE (PF) 100 MCG/2ML IJ SOLN
25.0000 ug | Freq: Once | INTRAMUSCULAR | Status: DC
  Filled 2021-05-09: qty 2

## 2021-05-09 MED ORDER — POTASSIUM CHLORIDE CRYS ER 20 MEQ PO TBCR
40.0000 meq | EXTENDED_RELEASE_TABLET | Freq: Once | ORAL | Status: AC
  Administered 2021-05-09: 40 meq via ORAL
  Filled 2021-05-09: qty 2

## 2021-05-09 NOTE — ED Provider Notes (Signed)
Blood pressure (!) 162/107, pulse 67, temperature 98.7 F (37.1 C), temperature source Oral, resp. rate 18, SpO2 91 %.  Assuming care from Dr. Regenia Skeeter.  In short, Donna Hill is a 87 y.o. female with a chief complaint of Shoulder Injury and Dysuria .  Refer to the original H&P for additional details.  The current plan of care is to follow up on repeat troponin and reassess.  03:56 PM  Patient's second troponin is unchanged at 10.  Patient apparently has chronic hypokalemia and was given potassium here.  There is some pleural effusion noted on chest x-ray but she is not short of breath or hypoxemic.  On my reassessment she is very eager to be discharged.  Discussed giving her the contact information for sports medicine doctor regarding her shoulder pain along with PCP follow-up plan.  She is agreeable to this.    Margette Fast, MD 05/09/21 1558

## 2021-05-09 NOTE — ED Provider Notes (Addendum)
[facility] DEPT Provider Note   CSN: [id number] Arrival date & time: 05/09/21  1113     History Chief Complaint  Patient presents with   Shoulder Injury   Dysuria    Donna Hill is a 87 y.o. female.  HPI 87 year old female presents with a chief complaint of left shoulder pain.  Started acutely yesterday when she was try to get out of bed.  She is worried she dislocated it though has never had this happen before.  Pain is severe and mostly posterior shoulder.  No chest pain.  When asked, she does endorse 2 weeks of cough and shortness of breath that she states feels like allergies/sinus problems.  Has some chronic back pain.  There was report that she has urinary tract infection symptoms but she denies this.  Took some Tylenol prior to EMS bringing her in and she feels somewhat better.  Past Medical History:  Diagnosis Date   Arthritis    Blood transfusion without reported diagnosis    Cancer ([place])    thyroid cancer   GERD (gastroesophageal reflux disease)    Heart murmur    Hypertension    Osteoporosis    Thyroid disease     Patient Active Problem List   Diagnosis Date Noted   Acute lower UTI 04/14/2021   Multiple rib fractures 04/09/2021   Pleural effusion 04/08/2021   Acute urinary retention 04/08/2021   Hypoacusis 04/08/2021   Urinary retention 04/08/2021   Hypertension 09/11/2012   Hypothyroidism 09/11/2012   Osteoporosis 09/11/2012   GERD (gastroesophageal reflux disease) 09/11/2012    Past Surgical History:  Procedure Laterality Date   ABDOMINAL HYSTERECTOMY     APPENDECTOMY     BREAST SURGERY     MASTOID DEBRIDEMENT     SPINE SURGERY     thyroid  cancer       OB History   No obstetric history on file.     No family history on file.  Social History   Tobacco Use   Smoking status: Never   Smokeless tobacco: Never    Home Medications Prior to Admission medications   Medication Sig Start Date End Date  Taking? Authorizing Provider  acetaminophen (TYLENOL) 325 MG tablet Take 2 tablets (650 mg total) by mouth every 6 (six) hours as needed for mild pain or moderate pain (or Fever >/= 101). 04/14/21   Arrien, Jimmy Picket, MD  amLODipine (NORVASC) 10 MG tablet Take 1 tablet (10 mg total) by mouth daily. 04/14/21 05/14/21  Arrien, Jimmy Picket, MD  feeding supplement (ENSURE ENLIVE / ENSURE PLUS) LIQD Take 237 mLs by mouth 2 (two) times daily between meals. 04/14/21 05/14/21  Arrien, Jimmy Picket, MD  fluticasone (VERAMYST) 27.5 MCG/SPRAY nasal spray Place 1 spray into the nose daily as needed for rhinitis or allergies.    [provider]  lisinopril (ZESTRIL) 5 MG tablet Take 1 tablet (5 mg total) by mouth daily. 04/14/21 05/14/21  Arrien, Jimmy Picket, MD  PATANOL 0.1 % ophthalmic solution Place 1 drop into both eyes at bedtime. 03/27/19   [provider]  polyethylene glycol (MIRALAX / GLYCOLAX) 17 g packet Take 17 g by mouth daily as needed for mild constipation or moderate constipation. 04/14/21   Arrien, Jimmy Picket, MD  RABEprazole (ACIPHEX) 20 MG tablet Take 20 mg by mouth daily.    [provider]  SYNTHROID 137 MCG tablet Take 137 mcg by mouth daily. 03/27/19   [provider]  Allergies    Codeine, Histamine, Meperidine, and Poison ivy extract  Review of Systems   Review of Systems  Respiratory:  Positive for cough and shortness of breath.   Cardiovascular:  Negative for chest pain.  Musculoskeletal:  Positive for arthralgias.  Neurological:  Negative for weakness and numbness.  All other systems reviewed and are negative.  Physical Exam Updated Vital Signs BP (!) 162/107 (BP Location: Right Arm)   Pulse 67   Temp 98.7 F (37.1 C) (Oral)   Resp 18   SpO2 91%   Physical Exam Vitals and nursing note reviewed.  Constitutional:      Appearance: She is well-developed.  HENT:     Head: Normocephalic and atraumatic.     Right Ear: External  ear normal.     Left Ear: External ear normal.     Nose: Nose normal.  Eyes:     General:        Right eye: No discharge.        Left eye: No discharge.  Cardiovascular:     Rate and Rhythm: Normal rate and regular rhythm.     Pulses:          Radial pulses are 2+ on the left side.     Heart sounds: Normal heart sounds.  Pulmonary:     Effort: Pulmonary effort is normal.     Breath sounds: Normal breath sounds.  Abdominal:     Palpations: Abdomen is soft.     Tenderness: There is no abdominal tenderness.  Musculoskeletal:     Left shoulder: Tenderness (posterior) present. No swelling or deformity. Decreased range of motion.     Comments: I am able to range her left shoulder without significant pain until I get her arm above her shoulder joint, which then induces pain. No dislocation/deformity  Skin:    General: Skin is warm and dry.  Neurological:     Mental Status: She is alert.  Psychiatric:        Mood and Affect: Mood is not anxious.    ED Results / Procedures / Treatments   Labs (all labs ordered are listed, but only abnormal results are displayed) Labs Reviewed  BASIC METABOLIC PANEL - Abnormal; Notable for the following components:      Result Value   Sodium 130 (*)    Potassium 3.0 (*)    Chloride 92 (*)    BUN 24 (*)    All other components within normal limits  CBC WITH DIFFERENTIAL/PLATELET - Abnormal; Notable for the following components:   WBC 10.6 (*)    Hemoglobin 11.0 (*)    HCT 35.7 (*)    MCH 25.5 (*)    RDW 15.9 (*)    Neutro Abs 8.7 (*)    Monocytes Absolute 1.1 (*)    All other components within normal limits  BRAIN NATRIURETIC PEPTIDE - Abnormal; Notable for the following components:   B Natriuretic Peptide 346.7 (*)    All other components within normal limits  MAGNESIUM  URINALYSIS, ROUTINE W REFLEX MICROSCOPIC  TROPONIN I (HIGH SENSITIVITY)  TROPONIN I (HIGH SENSITIVITY)    EKG EKG Interpretation  Date/Time:  Friday May 09 2021  12:22:58 EDT Ventricular Rate:  64 PR Interval:  152 QRS Duration: 160 QT Interval:  476 QTC Calculation: 491 R Axis:   172 Text Interpretation: Normal sinus rhythm Right bundle branch block Confirmed by Sherwood Gambler [phone]) on 05/09/2021 12:29:24 PM  Radiology DG Chest Portable 1 View  Result  Date: 05/09/2021 CLINICAL DATA:  Dyspnea, cough. EXAM: PORTABLE CHEST 1 VIEW COMPARISON:  April 10, 2021. FINDINGS: Stable cardiomegaly. No pneumothorax is noted. Bilateral hilar and basilar opacities are noted concerning for edema. Small left pleural effusion is noted. Bony thorax is unremarkable. IMPRESSION: Bilateral pulmonary edema is noted with small left pleural effusion. Aortic Atherosclerosis (ICD10-I70.0). Electronically Signed   By: Marijo Conception M.D.   On: 05/09/2021 12:43   DG Shoulder Left  Result Date: 05/09/2021 CLINICAL DATA:  Pain, history of dislocation EXAM: LEFT SHOULDER - 2+ VIEW COMPARISON:  None. FINDINGS: There is no evidence of fracture or dislocation. There is no evidence of arthropathy or other focal bone abnormality. Soft tissues are unremarkable. Cardiomegaly and left pleural effusion, better assessed by dedicated chest radiograph. IMPRESSION: 1. No fracture or dislocation of the left shoulder. Joint spaces are preserved. 2. Cardiomegaly and left pleural effusion, better assessed by dedicated chest radiograph. Electronically Signed   By: Eddie Candle M.D.   On: 05/09/2021 12:42    Procedures Procedures   Medications Ordered in ED Medications  potassium chloride SA (KLOR-CON) CR tablet 40 mEq (has no administration in time range)  acetaminophen (TYLENOL) tablet 500 mg (has no administration in time range)    ED Course  I have reviewed the triage vital signs and the nursing notes.  Pertinent labs & imaging results that were available during my care of the patient were reviewed by me and considered in my medical decision making (see chart for details).    MDM  Rules/Calculators/A&P                          X-rays have been personally reviewed and show no obvious shoulder fracture or dislocation.  Troponin sent because of atraumatic or least minimally traumatic shoulder pain though is probably muscular given it hurts worse to move.  However with her significant comorbidities I think 2 troponins is reasonable.  If this second troponin is negative I think she can be discharged home.  We will give some potassium for what appears to be chronic hypokalemia.  Otherwise she has declined thing stronger for pain.  Will transfer to care to Dr. Laverta [place] with 2nd troponin pending. Final Clinical Impression(s) / ED Diagnoses Final diagnoses:  None    Rx / DC Orders ED Discharge Orders     None        Sherwood Gambler, MD 05/09/21 1541    Sherwood Gambler, MD 05/09/21 [phone]

## 2021-05-09 NOTE — Discharge Instructions (Addendum)

## 2021-05-09 NOTE — ED Triage Notes (Signed)
Pt presents from home after having dysuria x 3 days and shoulder pain on the left side. Hx of dislocation. No injury noted. Alert and oriented. 88% on RA. 96% 2L. Lives with husband. Walks with walker. Has MOST form.

## 2022-10-06 IMAGING — CT CT CHEST W/O CM
2 of 3 series · 15 of 36 positions shown, 18 images · non-contrast
Comparison: Chest radiograph 04/08/2021 and thoracic spine
examination 03/26/2021 and thoracic spine MRI 12/27/2020 and
abdominal CT 09/17/2020

CLINICAL DATA: Left back and flank pain.

EXAM:
CT CHEST WITHOUT CONTRAST
TECHNIQUE: Multidetector CT imaging of the chest was performed following the
standard protocol without IV contrast.

[Series 3: thorax · axial · 0.59mm/px · z∈[+1366,+1568]mm · 12 of 119 slices shown, 15 images]
[im 9/119  mediastinal]
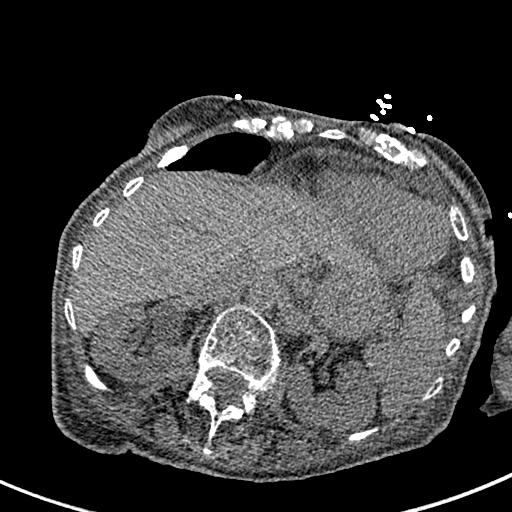
[im 9/119  lung]
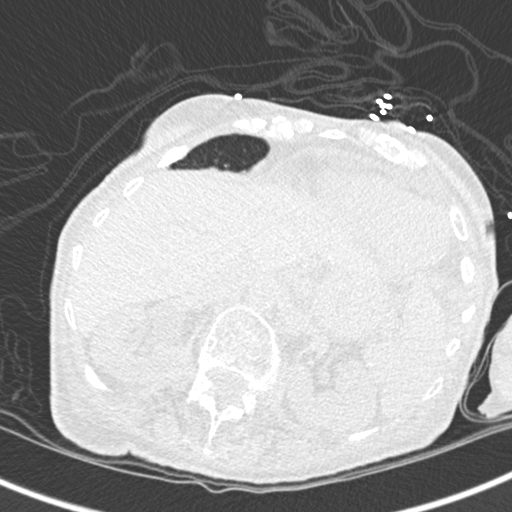
[im 18/119  lung]
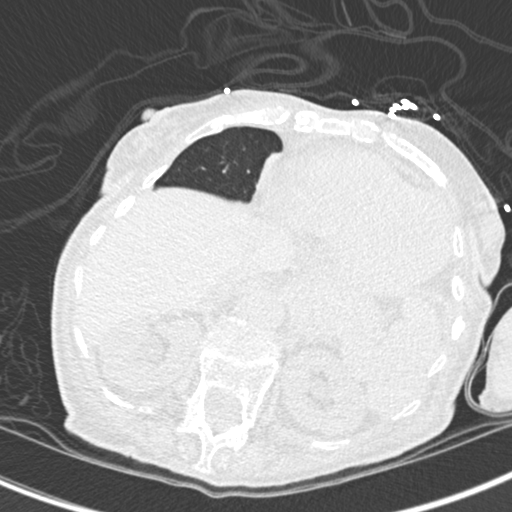
[im 27/119  lung]
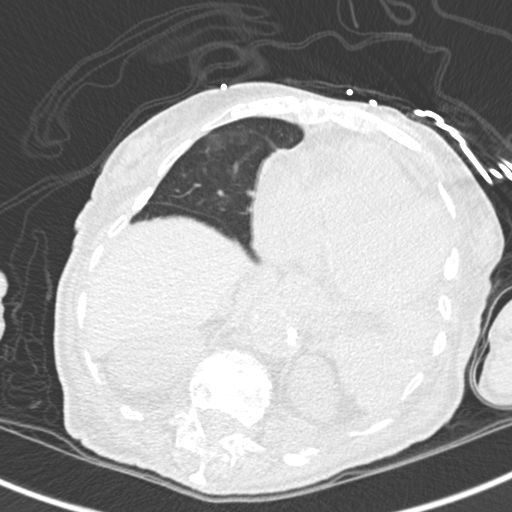
[im 35/119  lung]
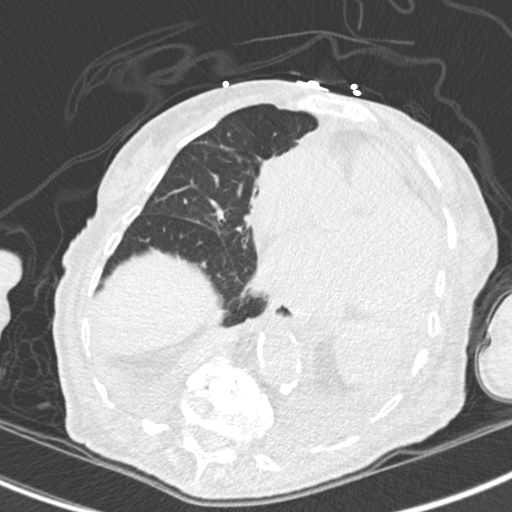
[im 44/119  mediastinal]
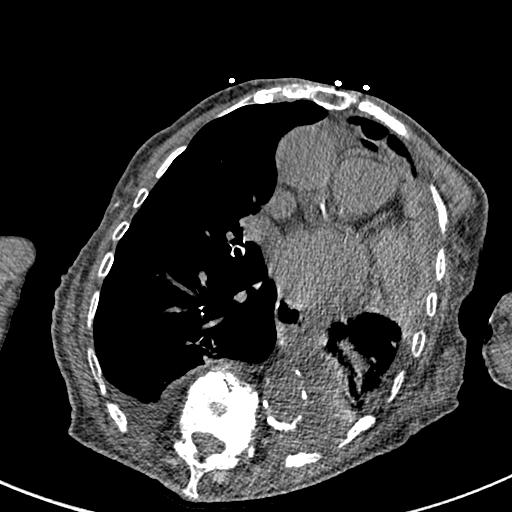
[im 44/119  lung]
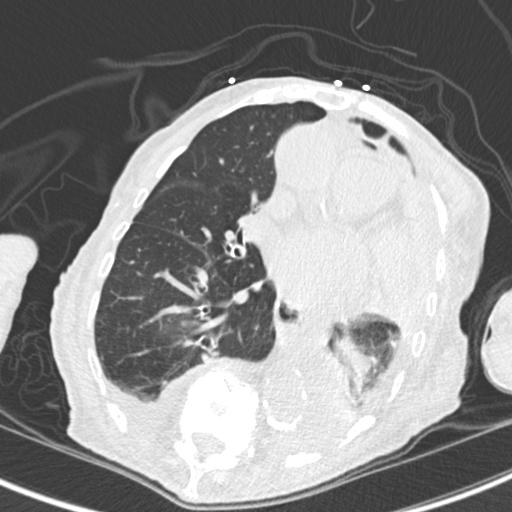
[im 53/119  lung]
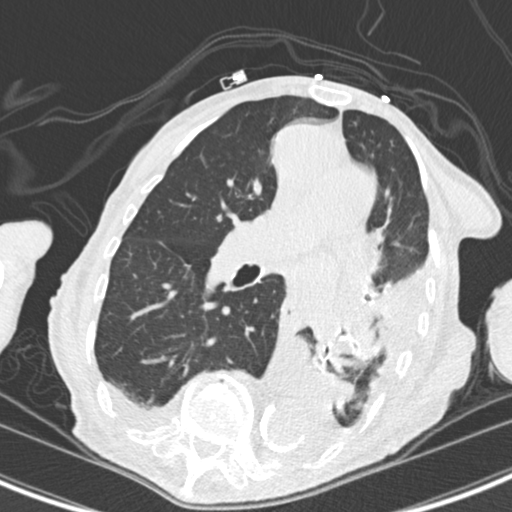
[im 66/119  lung]
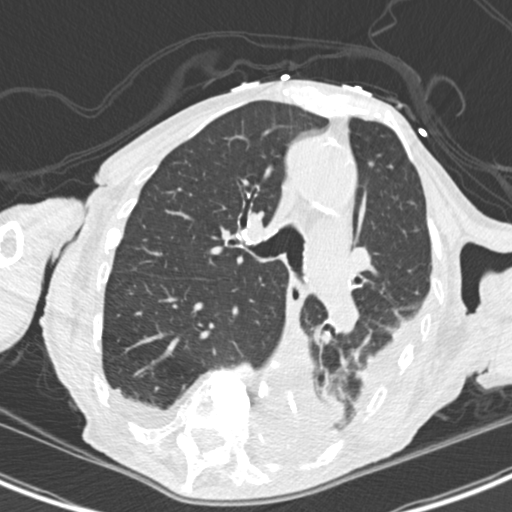
[im 75/119  lung]
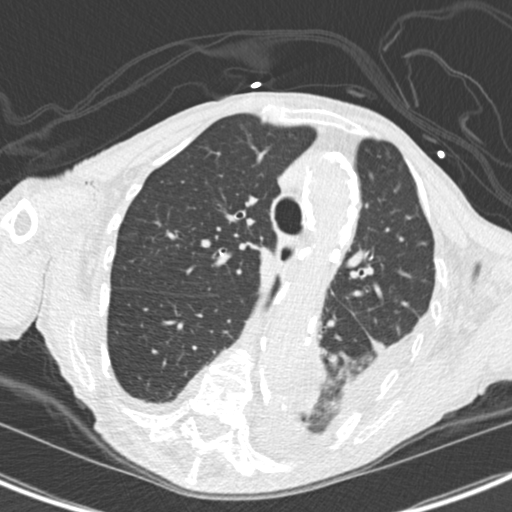
[im 84/119  mediastinal]
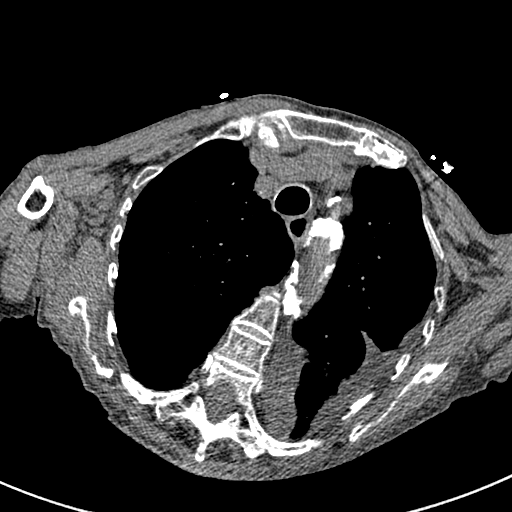
[im 84/119  lung]
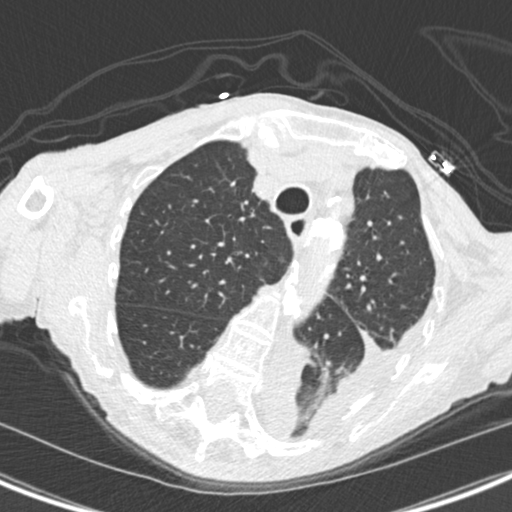
[im 92/119  lung]
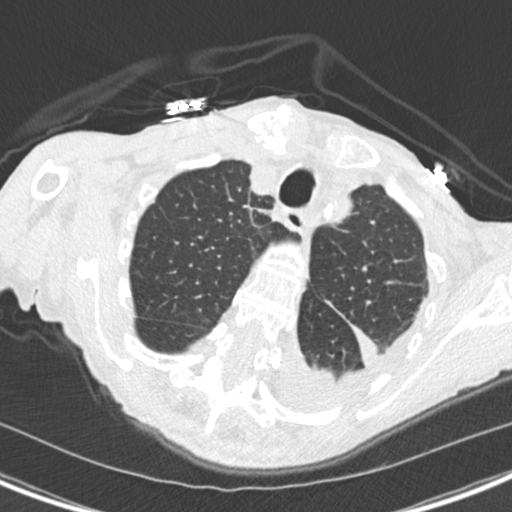
[im 101/119  lung]
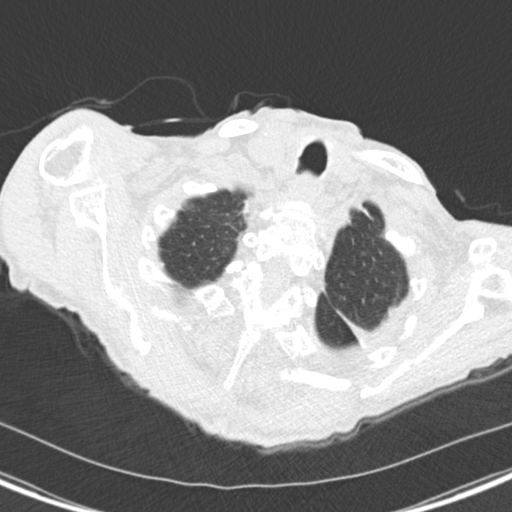
[im 110/119  lung]
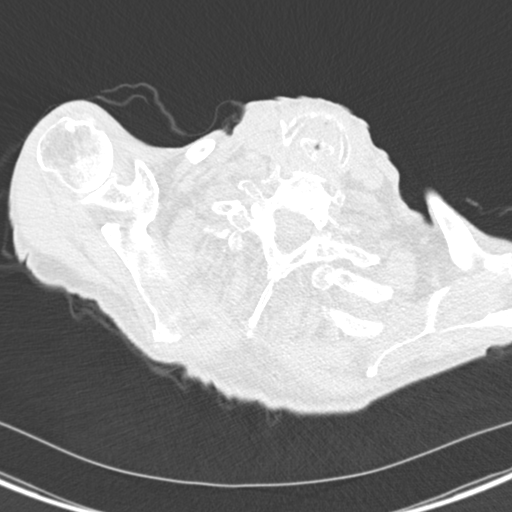

[Series 5: coronal · coronal · 0.47mm/px · 3 of 139 slices shown]
[im 28/139  lung]
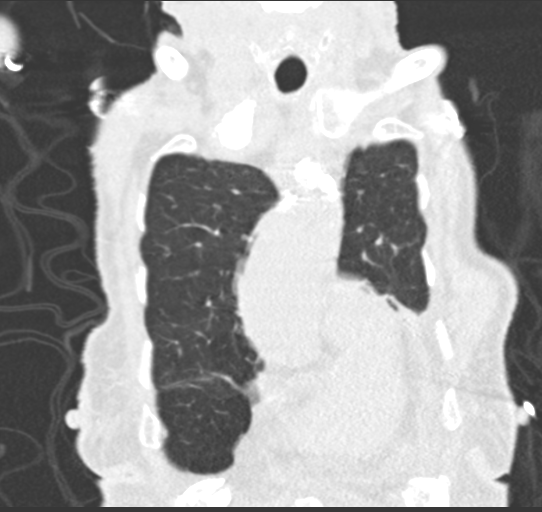
[im 56/139  lung]
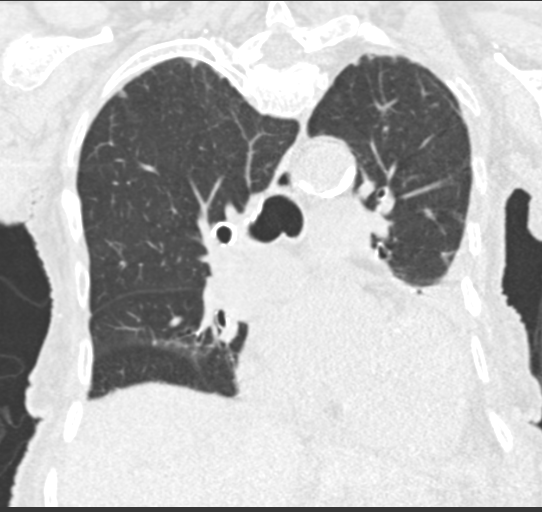
[im 83/139  lung]
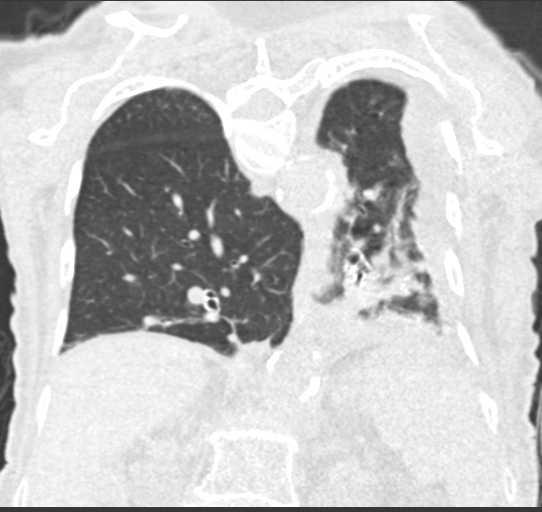

[15 of 36 positions shown; findings below may reference images not displayed]

FINDINGS: Cardiovascular: Thoracic aorta is heavily calcified. Normal caliber
of the thoracic aorta. Coronary artery calcifications.

Mediastinum/Nodes: No significant chest lymphadenopathy but limited
evaluation of the left hilum due to volume loss in left hemithorax.
Lack of intravascular contrast also limits evaluation of the
mediastinal structures.

Lungs/Pleura: Trachea and mainstem bronchi are patent. Small to
moderate amount of loculated left pleural fluid. Volume loss
throughout the left lower lobe. Mild scarring at the right lung
apex. Trace right pleural fluid.

Upper Abdomen: Enlargement of the renal pelvis bilaterally. Evidence
for at least mild hydronephrosis. The hydronephrosis is new since
09/17/2020. Limited evaluation of the abdomen without intravascular
contrast.

Musculoskeletal: Numerous old right rib fractures. Numerous old left
rib fractures. Acute displaced fractures involving the posterior
left eighth rib. Severely displaced fracture involving the posterior
left ninth rib just posterior to the descending thoracic aorta.
Segmental fractures involving the posterior left tenth rib posterior
to the thoracic aorta. Displaced fracture involving the left
eleventh rib. Displaced fracture involving the posterior left
seventh rib. Severe kyphosis of the thoracic spine. Normal alignment
at the cervicothoracic junction. There is a bone cement within the
T8 vertebral body, T11 and T12 vertebral bodies. There are old
compression fractures at T6, T7, T8, T10, T11 and T12. There also
appears to be an old compression fracture involving superior
endplate at L1.
IMPRESSION: 1. Acute displaced fractures involving left posterior ribs (8th-88th
ribs).
2. Small to moderate sized loculated left pleural effusion. Limited
evaluation of the pleural fluid without intravascular contrast.
3. At least mild hydronephrosis bilaterally. Recommend further
characterization with renal ultrasound or CT of the abdomen and
pelvis.
4. Small amount of right pleural fluid.
5. Volume loss in the left lower lobe.
6. Multiple vertebral body compression fractures. These fractures
have not significant changed since MRI on 12/27/2020.

## 2022-10-06 IMAGING — US US RENAL
1 series · 15 of 25 positions shown · non-contrast
Comparison: None.

CLINICAL DATA: 87-year-old female with hydronephrosis.

EXAM:
RENAL / URINARY TRACT ULTRASOUND COMPLETE

[Series 1: us renal mc & wl · 15 of 28 slices shown]
[im 1/28]
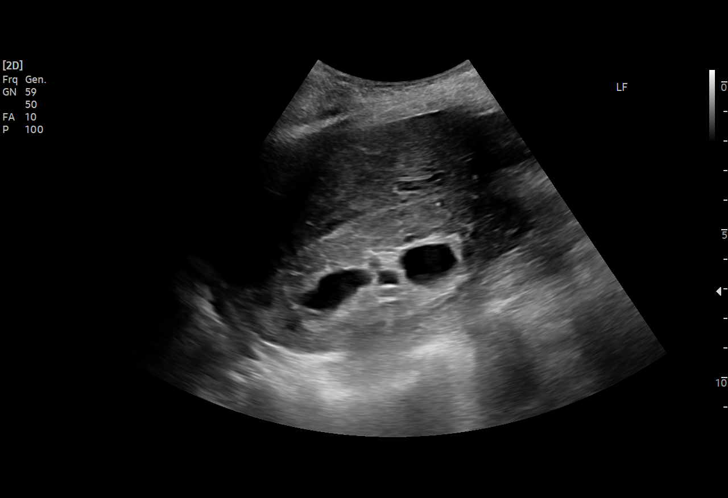
[im 3/28]
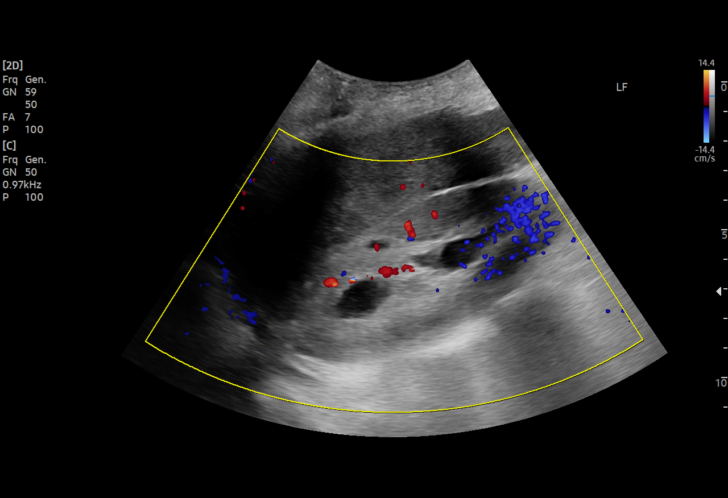
[im 5/28]
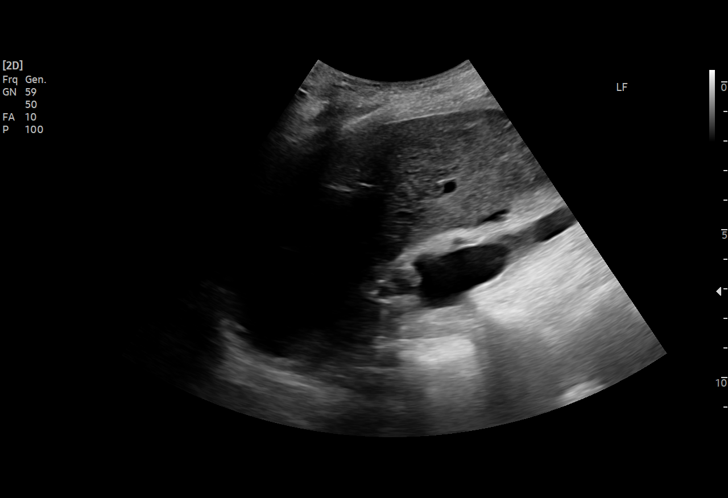
[im 6/28]
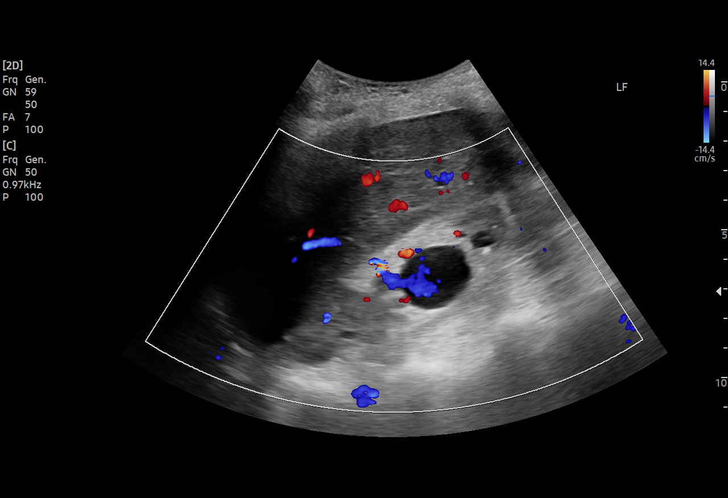
[im 8/28]
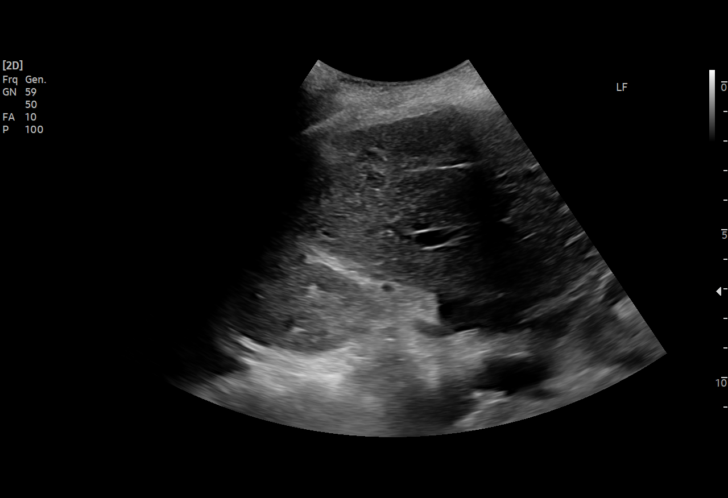
[im 11/28]
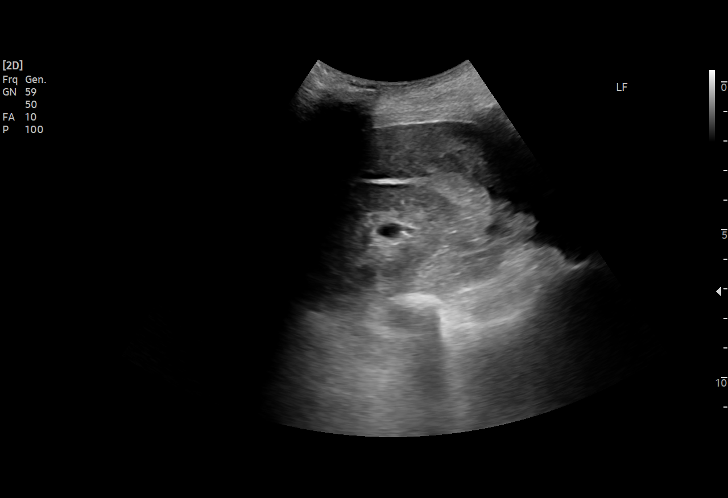
[im 12/28]
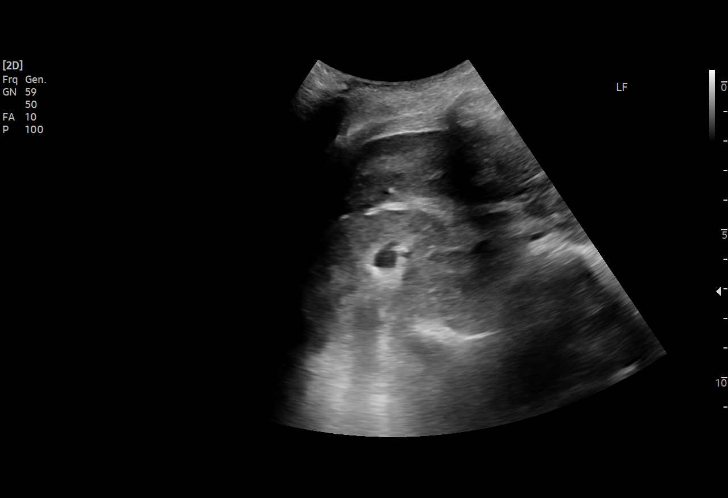
[im 14/28]
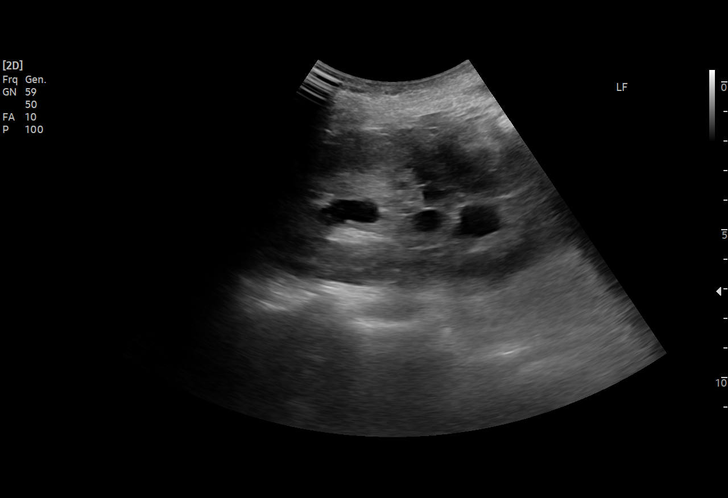
[im 16/28]
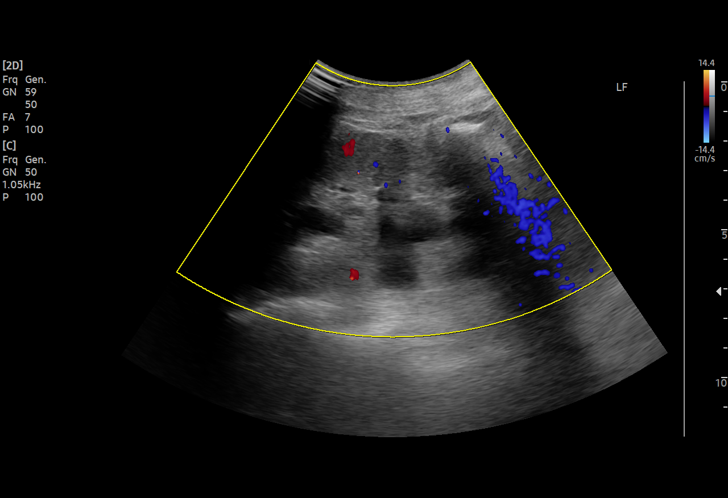
[im 17/28]
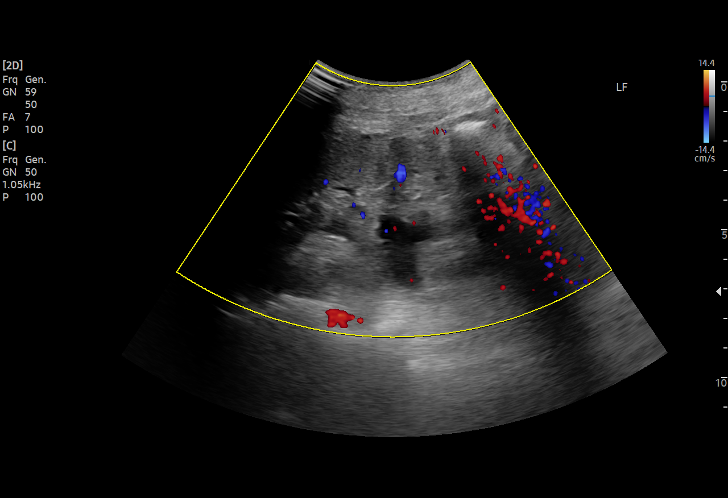
[im 20/28]
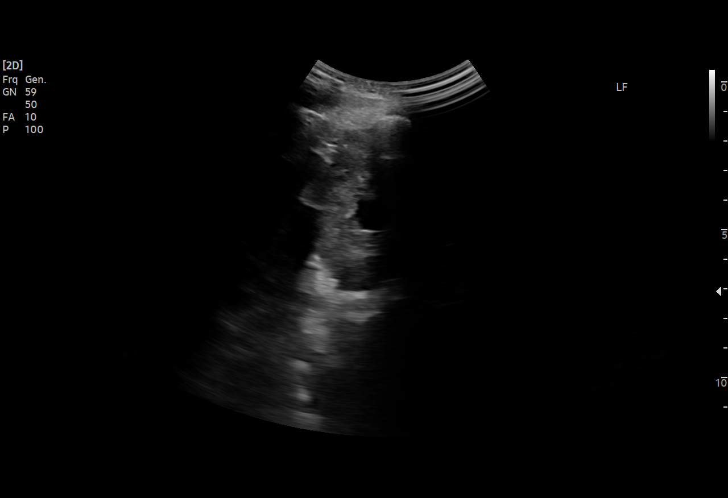
[im 22/28]
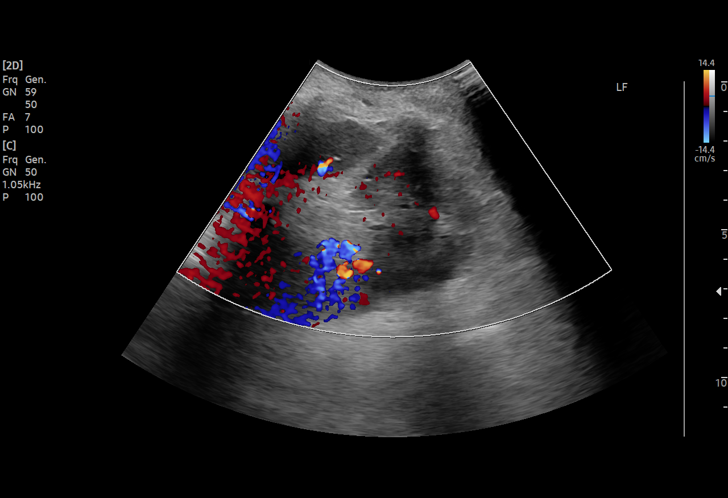
[im 23/28]
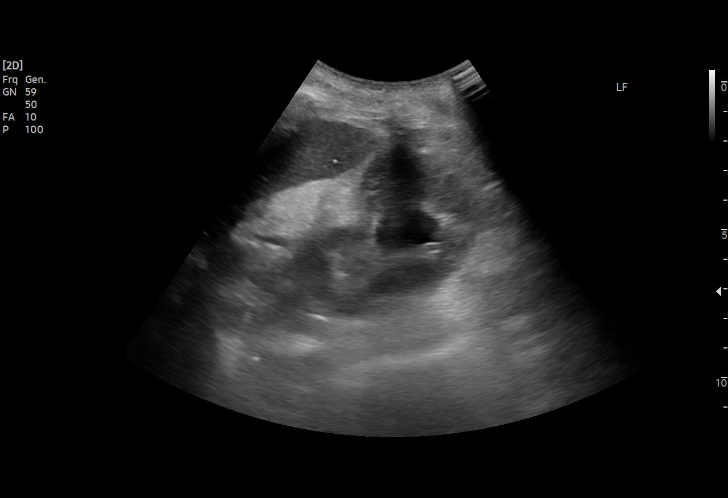
[im 25/28]
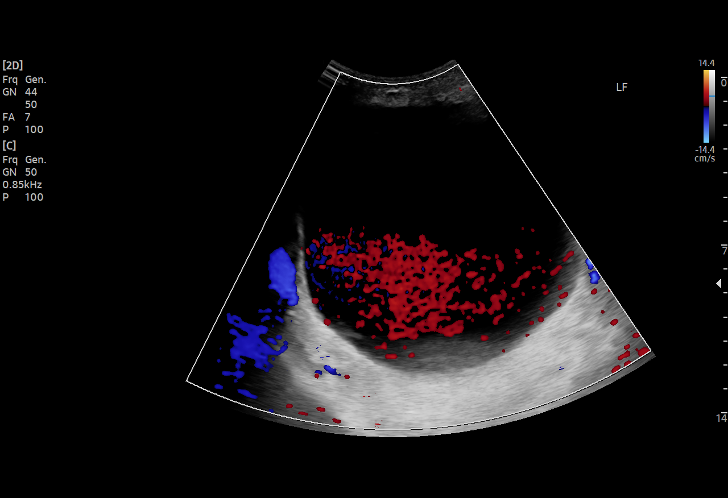
[im 28/28]
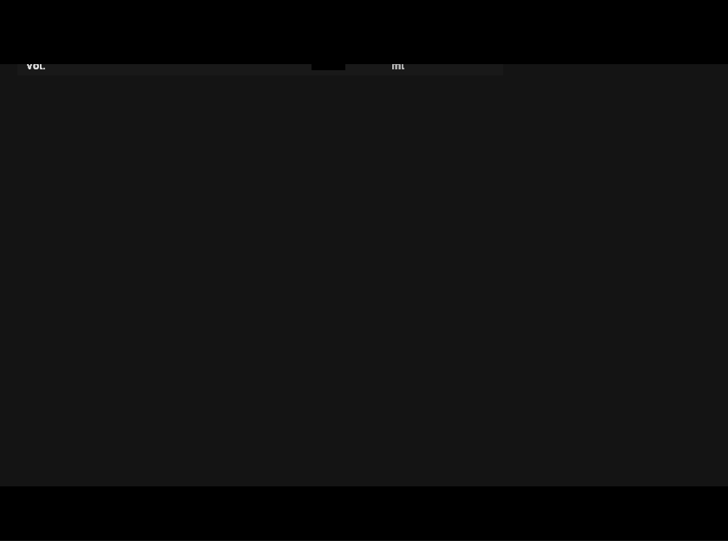

[15 of 25 positions shown; findings below may reference images not displayed]

FINDINGS: Evaluation is limited due to inability of the patient to cooperate
with exam and overlying bowel gas.

Right Kidney:

Renal measurements: 10.8 x 4.2 x 5.4 cm = volume: 126 mL. Normal
echogenicity. Mild hydronephrosis. No shadowing stone.

Left Kidney:

Renal measurements: 10.0 x 5.2 x 5.3 cm = volume: 144 mL. Normal
echogenicity. Mild hydronephrosis. No shadowing stone.

Bladder:

Appears normal for degree of bladder distention. The ureteral jets
are not identified. The prevoid bladder volume is 865 cc.

Other:

None.
IMPRESSION: Mild bilateral hydronephrosis.  No shadowing stone.

## 2022-10-08 IMAGING — DX DG CHEST 1V
1 series · 1 of 1 positions shown · non-contrast
Comparison: 04/08/2021

CLINICAL DATA: Dyspnea

EXAM:
CHEST  1 VIEW

[chest ap]
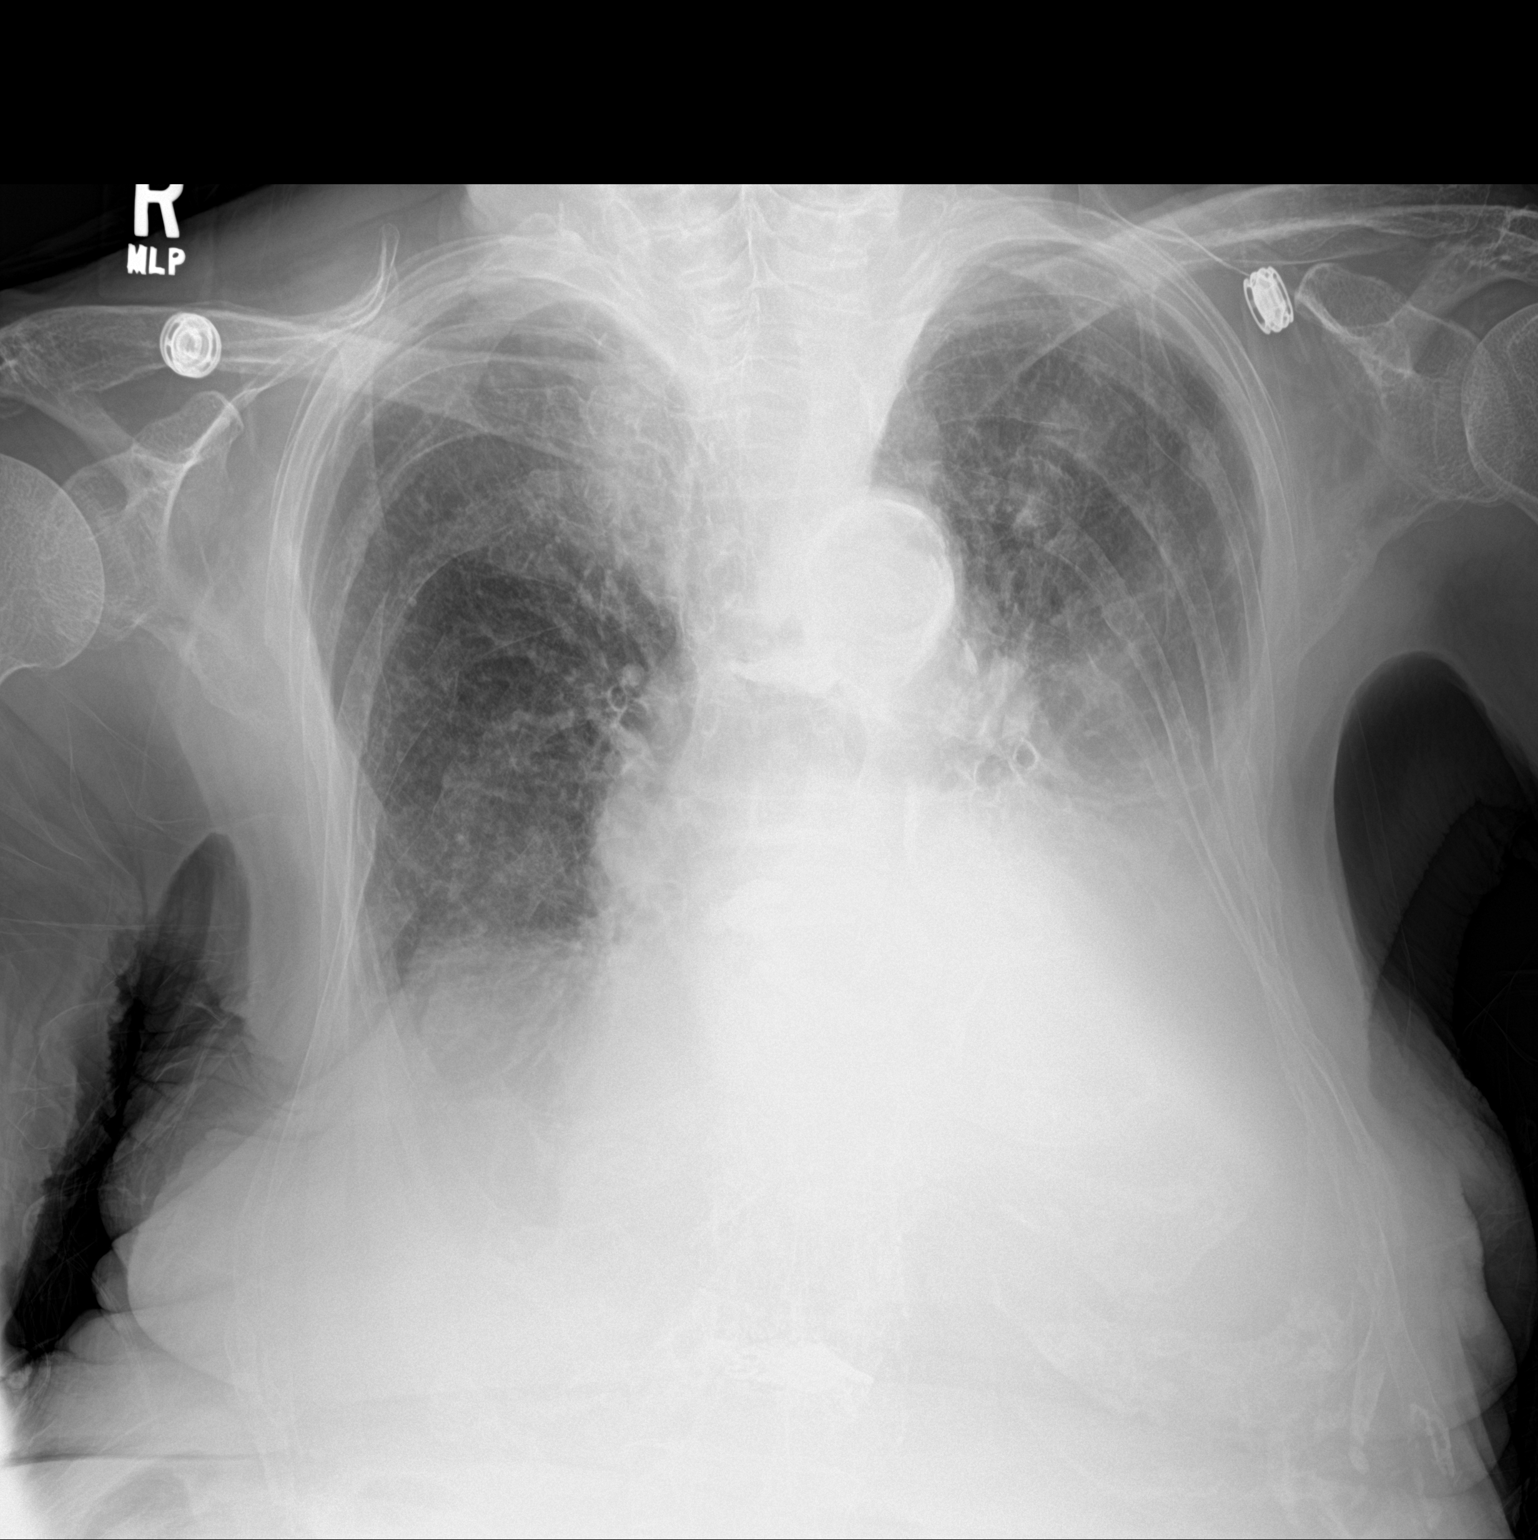

[1 of 1 positions shown; findings below may reference images not displayed]

FINDINGS: Cardiac shadow is enlarged but stable. Aortic calcifications are
noted. Bilateral pleural effusions are noted left greater than right
stable from the prior exam. Mild vascular congestion and edema is
seen. Changes of prior vertebral augmentation are noted at multiple
thoracic levels. Old healing rib fractures are noted bilaterally.
IMPRESSION: Bilateral effusions left greater than right stable from the prior
exam.

Vascular congestion and mild edema consistent with CHF.

Old rib trauma with healing and prior vertebral augmentation.

## 2022-11-06 IMAGING — DX DG CHEST 1V PORT
1 series · 1 of 1 positions shown · non-contrast
Comparison: April 10, 2021.

CLINICAL DATA: Dyspnea, cough.

EXAM:
PORTABLE CHEST 1 VIEW

[chest ap]
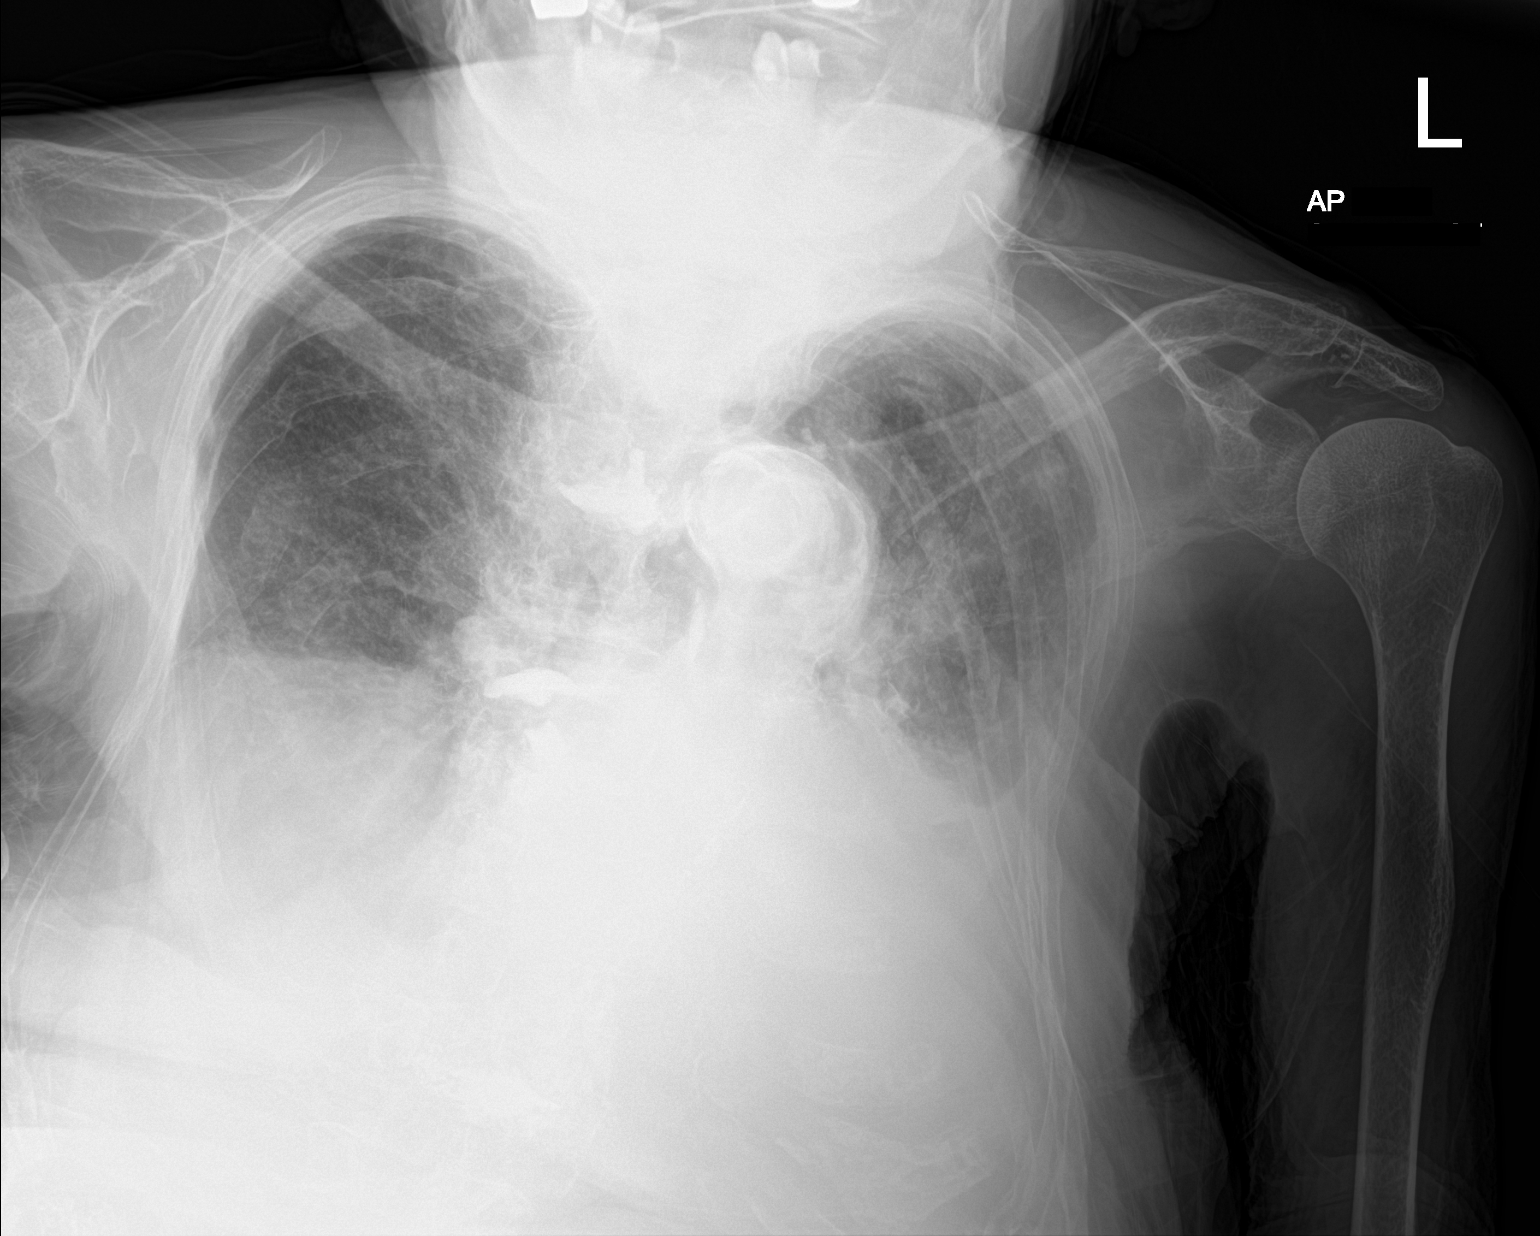

[1 of 1 positions shown; findings below may reference images not displayed]

FINDINGS: Stable cardiomegaly. No pneumothorax is noted. Bilateral hilar and
basilar opacities are noted concerning for edema. Small left pleural
effusion is noted. Bony thorax is unremarkable.
IMPRESSION: Bilateral pulmonary edema is noted with small left pleural effusion.

Aortic Atherosclerosis ([id number]).
